# Patient Record
Sex: Female | Born: 1990 | Race: White | Hispanic: No | Marital: Single | Smoking: Never smoker
Health system: Southern US, Community
[De-identification: ages and names within clinical notes are randomized; demographics above are authoritative.]

## PROBLEM LIST (undated history)

## (undated) DIAGNOSIS — F909 Attention-deficit hyperactivity disorder, unspecified type: Secondary | ICD-10-CM

## (undated) DIAGNOSIS — K219 Gastro-esophageal reflux disease without esophagitis: Secondary | ICD-10-CM

## (undated) DIAGNOSIS — L709 Acne, unspecified: Secondary | ICD-10-CM

## (undated) DIAGNOSIS — M92529 Juvenile osteochondrosis of tibia tubercle, unspecified leg: Secondary | ICD-10-CM

## (undated) DIAGNOSIS — M9250 Juvenile osteochondrosis of tibia and fibula, unspecified leg: Secondary | ICD-10-CM

## (undated) HISTORY — DX: Juvenile osteochondrosis of tibia and fibula, unspecified leg: M92.50

## (undated) HISTORY — DX: Gastro-esophageal reflux disease without esophagitis: K21.9

## (undated) HISTORY — DX: Attention-deficit hyperactivity disorder, unspecified type: F90.9

## (undated) HISTORY — DX: Juvenile osteochondrosis of tibia tubercle, unspecified leg: M92.529

## (undated) HISTORY — DX: Acne, unspecified: L70.9

## (undated) HISTORY — PX: WISDOM TOOTH EXTRACTION: SHX21

## (undated) HISTORY — PX: COLONOSCOPY: SHX174

---
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
## 2009-02-26 ENCOUNTER — Ambulatory Visit: Payer: Self-pay | Admitting: Internal Medicine

## 2009-02-26 DIAGNOSIS — F988 Other specified behavioral and emotional disorders with onset usually occurring in childhood and adolescence: Secondary | ICD-10-CM | POA: Insufficient documentation

## 2009-02-26 LAB — CONVERTED CEMR LAB
Glucose, Urine, Semiquant: NEGATIVE
Nitrite: NEGATIVE
WBC Urine, dipstick: NEGATIVE
pH: 6.5

## 2009-03-01 ENCOUNTER — Encounter: Payer: Self-pay | Admitting: Internal Medicine

## 2009-03-04 ENCOUNTER — Telehealth: Payer: Self-pay | Admitting: Internal Medicine

## 2009-03-07 ENCOUNTER — Telehealth: Payer: Self-pay | Admitting: Internal Medicine

## 2009-03-18 ENCOUNTER — Ambulatory Visit: Payer: Self-pay | Admitting: Internal Medicine

## 2009-03-19 ENCOUNTER — Ambulatory Visit: Payer: Self-pay | Admitting: Internal Medicine

## 2009-03-19 LAB — CONVERTED CEMR LAB
Albumin: 4 g/dL (ref 3.5–5.2)
Alkaline Phosphatase: 54 units/L (ref 39–117)
CO2: 29 meq/L (ref 19–32)
Chloride: 103 meq/L (ref 96–112)
Eosinophils Relative: 1.6 % (ref 0.0–5.0)
GFR calc non Af Amer: 114.04 mL/min (ref >60)
Glucose, Bld: 83 mg/dL (ref 70–99)
Lymphocytes Relative: 26.8 % (ref 12.0–46.0)
Monocytes Relative: 6.8 % (ref 3.0–12.0)
Neutrophils Relative %: 64.1 % (ref 43.0–77.0)
Platelets: 185 10*3/uL (ref 150.0–400.0)
Potassium: 4.1 meq/L (ref 3.5–5.1)
Sodium: 138 meq/L (ref 135–145)
TSH: 1.99 microintl units/mL (ref 0.35–5.50)
Total Protein: 7.2 g/dL (ref 6.0–8.3)
WBC: 5.1 10*3/uL (ref 4.5–10.5)

## 2009-03-20 ENCOUNTER — Encounter: Payer: Self-pay | Admitting: Internal Medicine

## 2009-03-21 ENCOUNTER — Telehealth: Payer: Self-pay | Admitting: Internal Medicine

## 2009-03-21 ENCOUNTER — Ambulatory Visit: Payer: Self-pay | Admitting: Internal Medicine

## 2009-03-21 ENCOUNTER — Encounter: Payer: Self-pay | Admitting: Internal Medicine

## 2009-03-22 ENCOUNTER — Encounter: Payer: Self-pay | Admitting: Internal Medicine

## 2009-06-11 ENCOUNTER — Telehealth: Payer: Self-pay | Admitting: Internal Medicine

## 2009-07-03 ENCOUNTER — Ambulatory Visit: Payer: Self-pay | Admitting: Internal Medicine

## 2009-07-03 DIAGNOSIS — L559 Sunburn, unspecified: Secondary | ICD-10-CM | POA: Insufficient documentation

## 2009-07-03 DIAGNOSIS — N926 Irregular menstruation, unspecified: Secondary | ICD-10-CM | POA: Insufficient documentation

## 2009-07-03 LAB — CONVERTED CEMR LAB: LDL Goal: 160 mg/dL

## 2009-07-08 ENCOUNTER — Telehealth: Payer: Self-pay | Admitting: *Deleted

## 2010-01-14 IMAGING — US US ABDOMEN COMPLETE
1 series · 13 of 25 positions shown · non-contrast
Comparison: None.

CLINICAL DATA: Hematuria.  Right-sided abdominal pain.

COMPLETE ABDOMINAL ULTRASOUND 07/26/2008:

[Series 1: us abdomen complete · 0.23mm/px · 13 of 99 slices shown]
[im 1/99]
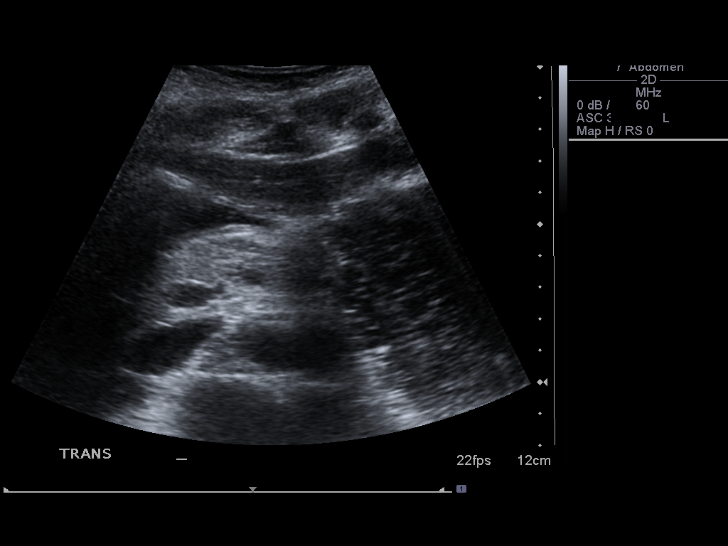
[im 9/99]
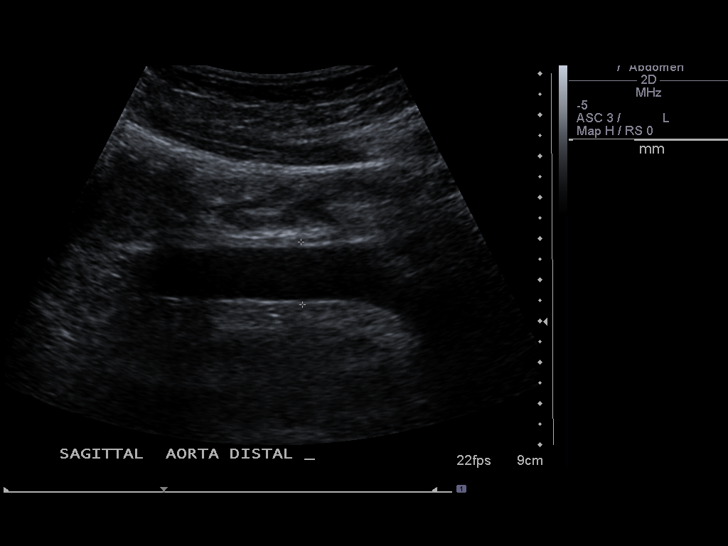
[im 17/99]
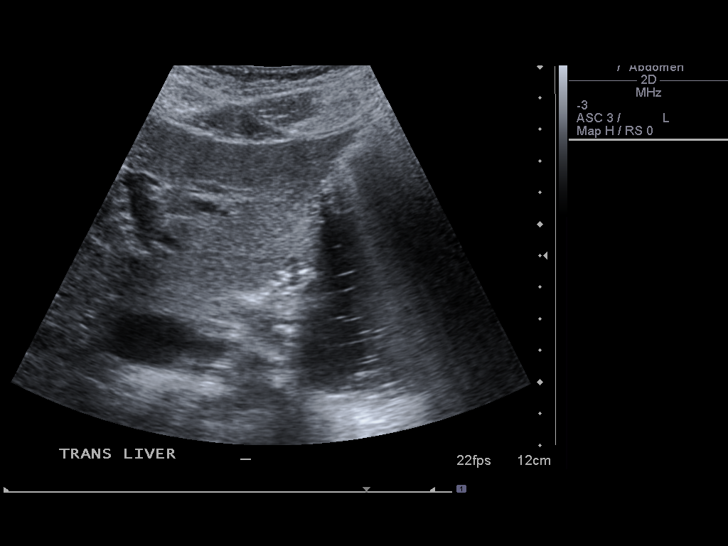
[im 25/99]
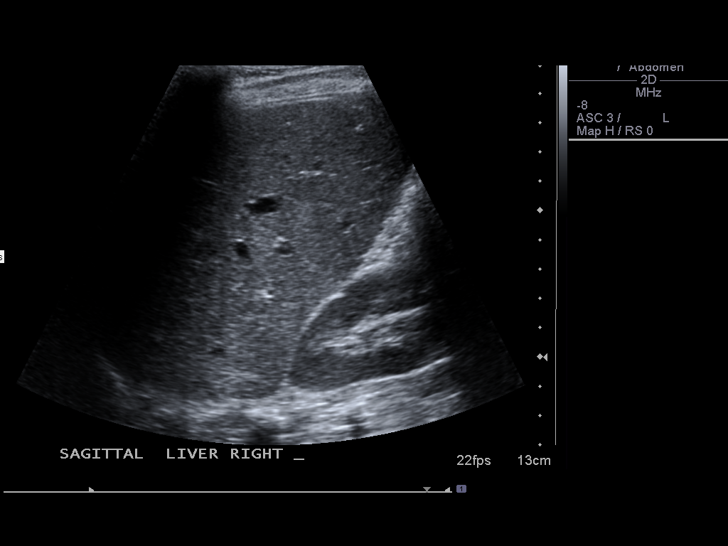
[im 33/99]
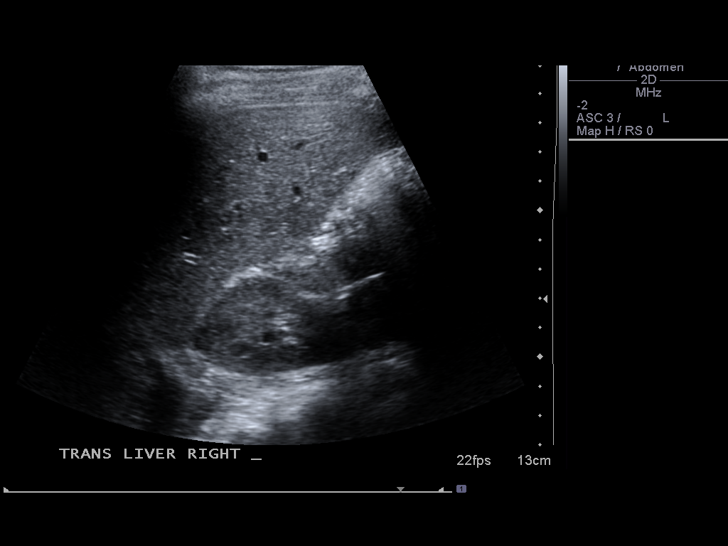
[im 41/99]
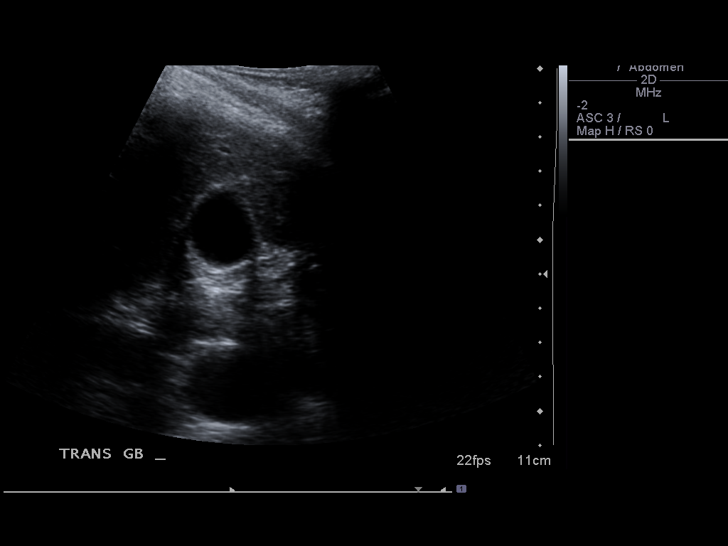
[im 50/99]
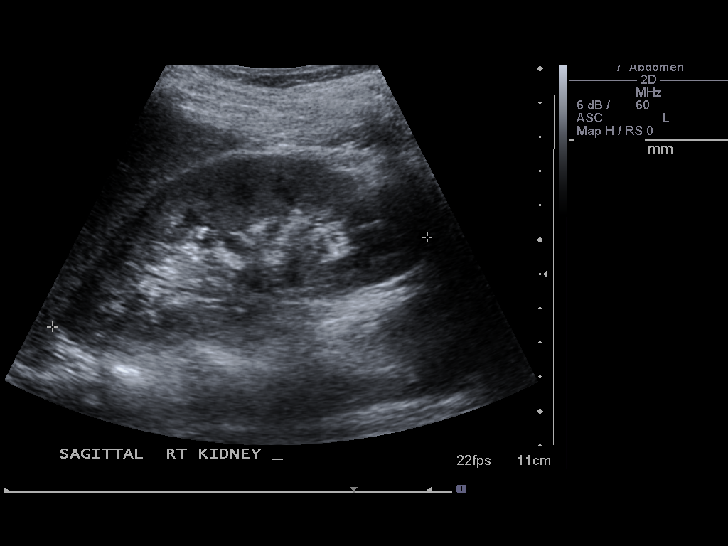
[im 58/99]
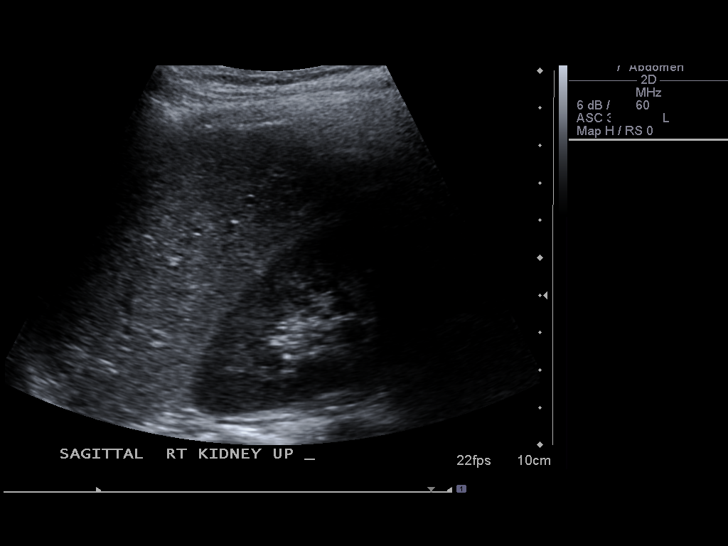
[im 66/99]
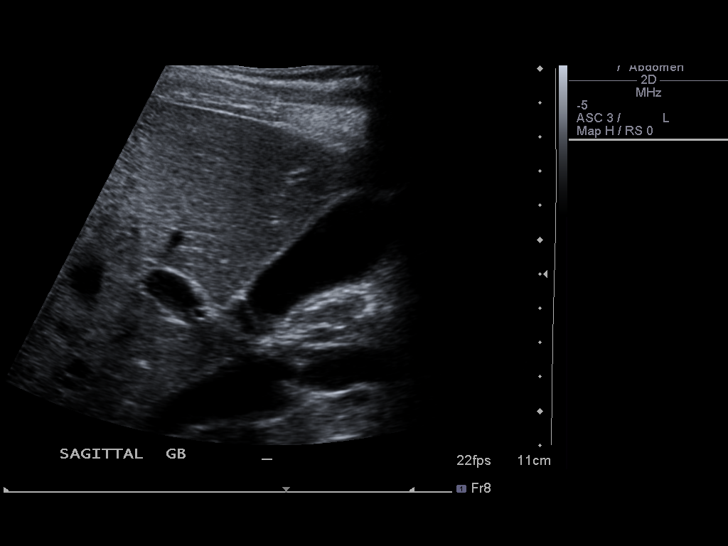
[im 74/99]
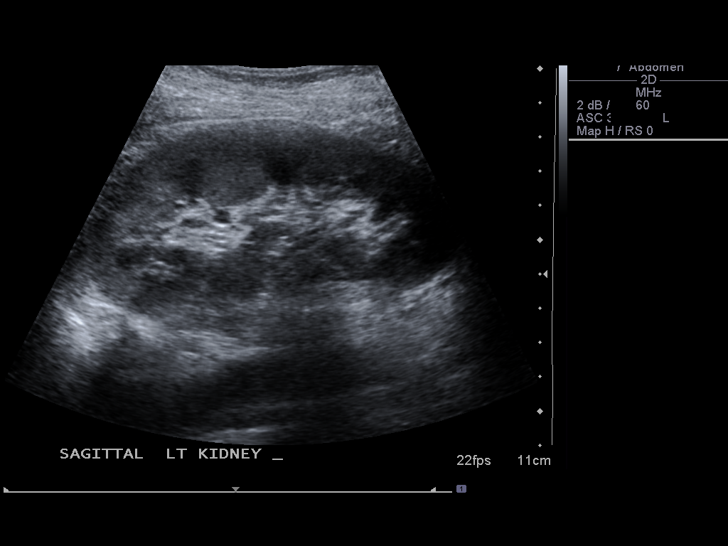
[im 82/99]
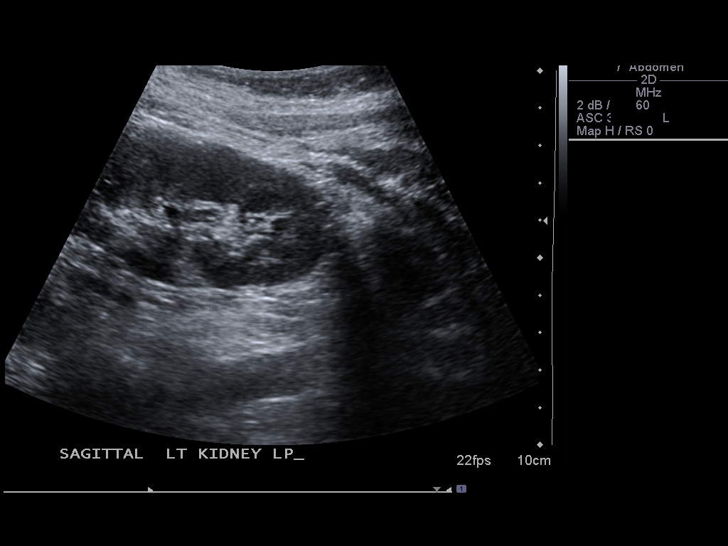
[im 90/99]
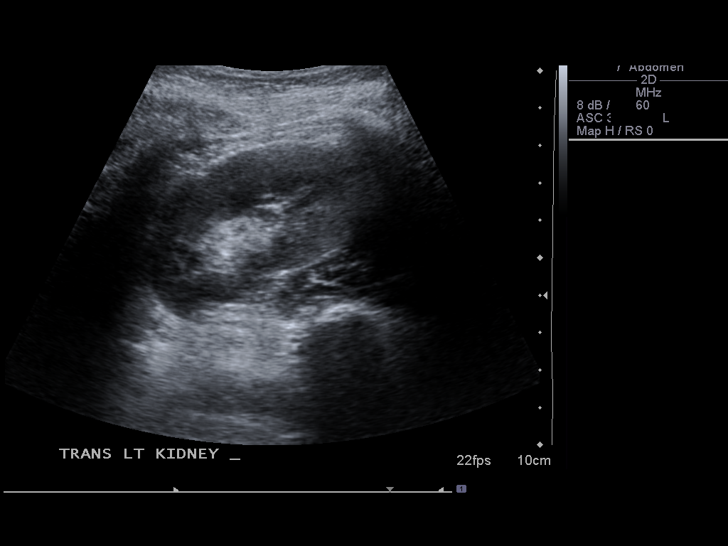
[im 99/99]
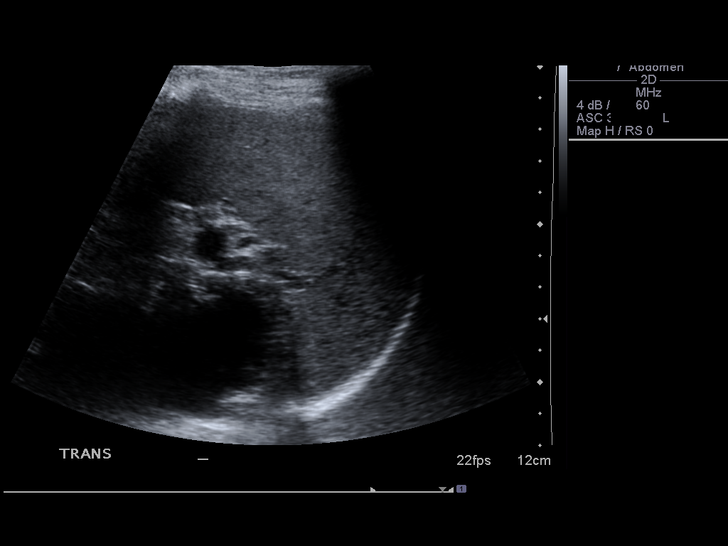

[13 of 25 positions shown; findings below may reference images not displayed]

FINDINGS: Gallbladder:  No shadowing gallstones or echogenic sludge.  No
gallbladder wall thickening or pericholecystic fluid.  Negative
sonographic Murphy's sign according to the ultrasound technologist.

Common bile duct:  Normal in caliber with maximum diameter
approximating 4 mm.

Liver:  Normal size and echotexture without focal parenchymal
abnormalities.  Patent portal vein with hepatopedal flow.

IVC:  Patent.

Pancreas:  Normal appearing head and body; tail obscured by
overlying bowel gas.

Spleen:  Normal size and echotexture without focal parenchymal
abnormality, measuring approximately 9.7 cm length.

Right Kidney:  No hydronephrosis.  Well-preserved cortex.  Normal
parenchymal echotexture without focal abnormalities.  No shadowing
calculi. Approximately 11.2 cm in length.

Left Kidney:  No hydronephrosis.  Well-preserved cortex.  Normal
parenchymal echotexture without focal abnormalities.  No shadowing
calculi. Approximately 11.0 in length.

Abdominal aorta:  Normal in caliber throughout its visualized
course in the abdomen with maximum diameter approximating 1.7 cm.

Other Findings:  Note made of fluid within the stomach.
IMPRESSION: Normal abdominal ultrasound with a caveat that the pancreatic tail
was obscured by overlying bowel gas and was therefore not
evaluated.  Of the

## 2010-02-05 ENCOUNTER — Encounter: Payer: Self-pay | Admitting: Internal Medicine

## 2010-04-15 NOTE — Progress Notes (Signed)
Summary: change from Autumn Castillo to seaonale  Phone Note From Pharmacy   Caller: [facility]  [address]  [phone]* Summary of Call: Pt is wanting to try to generic seasonale because Autumn Castillo is too expensive. Is it okay to change? Initial call taken by: Romualdo Bolk, CMA Duncan Dull),  July 08, 2009 9:38 AM  Follow-up for Phone Call        ok to do this  Follow-up by: Madelin Headings MD,  July 08, 2009 1:15 PM  Additional Follow-up for Phone Call Additional follow up Details #1::        Rx sent to pharmacy. Additional Follow-up by: Romualdo Bolk, CMA (AAMA),  July 08, 2009 3:25 PM    New/Updated Medications: SEASONALE 0.15-0.03 MG TABS (LEVONORGEST-ETH ESTRAD 91-DAY) 1 by mouth once daily Prescriptions: SEASONALE 0.15-0.03 MG TABS (LEVONORGEST-ETH ESTRAD 91-DAY) 1 by mouth once daily  #90 x 3   Entered by:   Romualdo Bolk, CMA (AAMA)   Authorized by:   Madelin Headings MD   Signed by:   Romualdo Bolk, CMA (AAMA) on 07/08/2009   Method used:   Electronically to        [facility]  [phone]* (retail)       [address]       [place], [place]  [address]       Ph: [phone] or [phone]       Fax: [phone]   RxID:   [id number]

## 2010-04-15 NOTE — Assessment & Plan Note (Signed)
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 She also has occasional nausea and vomiting. She has noticed intermittent bright red blood per rectum associated with normal-appearing bowel movements. She denies any rectal pain or history of hemorrhoids. She denies being constipated. Her weight has been stable. There is no family history of inflammatory bowel disease. She has not had any imaging studies other than a pelvic ultrasound. Her gynecologic evaluation was negative as far as the source of the pain is concerned. The pain is partially positional. It may be precipitated by physical activity although jogging does not bother her. It is not relieved by bowel movements or eating. The first episode of acute pain 2 years ago was relieved by vomiting. She takes Adderall 10 mg p.r.n. as well as Vivanse 40mg  by mouth once daily only when she is taking classes.AdHD   GI Review of Systems    Reports abdominal pain.     Location of  Abdominal pain: lower abdomen.    Denies acid reflux, belching, bloating, chest pain, dysphagia with liquids, dysphagia with solids, heartburn, loss of appetite, nausea, vomiting, vomiting blood, weight loss, and  weight gain.      Reports black tarry stools and  rectal bleeding.     Denies anal fissure, change in bowel habit, constipation, diarrhea, diverticulosis, fecal incontinence, heme positive stool, hemorrhoids, irritable bowel syndrome, jaundice, light color stool, liver problems, and  rectal pain.    Current Medications (verified): 1)  Vyvanse 40 Mg Caps (Lisdexamfetamine Dimesylate) .Marland Kitchen.. 1  By Mouth Once Daily 2)  Adderall 10 Mg Tabs (Amphetamine-Dextroamphetamine) .Marland Kitchen.. 1 By Mouth Once Daily As Needed For Homework 3)  Doryx(Dosage Unknown) .... One Tablet By Mouth Once Daily  Allergies (verified): No Known Drug Allergies  Past History:  Past Medical History: Reviewed history from 02/26/2009 and no changes required. Unremarkable fx arm 5th grade  osgood schlatter unremarkable birth hx  Acne  RX for adhd  Past Surgical History: Reviewed history from 11/02/2006 and no changes required. Denies surgical history  Family History: Reviewed history from 03/11/2009 and no changes required. Family History of Allergies No hx of renal stones or chronic GI disease PGf with GB disease No FH of Colon Cancer:  Social History: Reviewed history from 02/26/2009 and no changes required. Single intact family no tad no Oncologist  on campus. Ocass alcohol not a lot of caffeine   Review of Systems  The patient denies allergy/sinus, anemia, anxiety-new, arthritis/joint pain, back pain, blood in urine, breast changes/lumps, change in vision, confusion, cough, coughing up blood, depression-new, fainting, fatigue, fever, headaches-new, hearing problems, heart murmur, heart rhythm changes, itching, menstrual pain, muscle pains/cramps, night sweats, nosebleeds, pregnancy symptoms, shortness of breath, skin rash, sleeping problems, sore throat, swelling of feet/legs, swollen lymph glands, thirst - excessive , urination - excessive , urination changes/pain, urine leakage, vision changes, and voice change.         Pertinent positive and negative review of systems were noted in the above HPI. All other ROS was otherwise negative.   Vital Signs:  Patient profile:   20 year old female Menstrual status:  regular Height:      63.25 inches Weight:      139 pounds BMI:     24.52 BSA:     1.66 Pulse rate:   76 / minute Pulse rhythm:   regular BP sitting:   102 / 64  (left  arm) Cuff size:   regular  Vitals Entered By: Ok Anis CMA (March 18, 2009 10:42 AM)  Physical Exam  General:  Well developed, well nourished, no acute distress. Eyes:  PERRLA, no icterus. Mouth:  No deformity or lesions, dentition normal. Neck:  Supple; no masses or thyromegaly. Lungs:  Clear throughout to auscultation. Heart:  Regular rate and rhythm; no murmurs, rubs,  or bruits. Abdomen:  soft abdomen with good muscle support. Normoactive bowel sounds. No distention. Minimal tenderness in the right upper quadrant as well as right lower quadrant. No palpable mass or stool. Rectal:  normal rectal tone with soft Hemoccult negative stool. No external hemorrhoids. Msk:  straight leg raising, sitting up and laying down did not precipitate the abdominal pain. Extremities:  No clubbing, cyanosis, edema or deformities noted. Neurologic:  slow DTRs. Skin:  packing Psych:  Alert and cooperative. Normal mood and affect.   Impression & Recommendations:  Problem # 1:  ABDOMINAL PAIN RIGHT LOWER QUADRANT (ICD-789.03) Patient has chronic intermittent right lower quadrant abdominal pain without triggering factors. This could possibly be an element of musculoskeletal pain. It is very unlikely that we are dealing with chronic appendicitis but there is the possiblity of an internal abdominal hernia or Crohn's disease especially in association with her recent rectal bleeding. Functional problems may also precipitate the right lower quadrant abdominal pain; specifically irritable bowel syndrome and functional constipation. Gynecologic problems have been ruled out by her gynecologist but are still a possibility. We will proceed with a CT scan of the abdomen using oral and IV contrast with attention to the right lower quadrant. We will also proceed with a colonoscopy to look at the ileocecal valve, terminal ileum and obtain appropriate biopsies. If the workup is negative, I would encourage her to take  Levsin or Levbid on a regular basis.  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  Problem # 3:  ADD (ICD-314.00) Patient is on low dose Adderall.  Patient Instructions: 1)  TSH, sedimentation rate, CBC, CMET and tissue transglutaminase. 2)  Schedule CT scan of the abdomen and pelvis with attention to right lower quadrant. 3)  Colonoscopy with visualization of the right colon and terminal ileum. 4)  Consider the use of antispasmodics. 5)  Copy sent to : Dr Coral Ceo Prescriptions: DULCOLAX 5 MG  TBEC (BISACODYL) Day before procedure take 2 at 3pm and 2 at 8pm.  #4 x 0   Entered by:   Hortense Ramal CMA (AAMA)   Authorized by:   Hart Carwin MD   Signed by:   Hortense Ramal CMA (AAMA) on 03/18/2009   Method used:   Electronically to        [facility]  [phone]* (retail)       [address]       [place], [place]  [address]       Ph: [phone] or [phone]       Fax: [phone]   RxID:   [id number] REGLAN 10 MG  TABS (METOCLOPRAMIDE HCL) As per prep instructions.  #2 x 0   Entered by:   Hortense Ramal CMA (AAMA)   Authorized by:   Hart Carwin MD   Signed by:   Hortense Ramal CMA (AAMA) on 03/18/2009   Method used:   Electronically to        [facility]  [phone]* (retail)       [address]       [place], [place]  [address]       Ph: [phone] or [phone]       Fax: [phone]   RxID:   [id number] MIRALAX   POWD (POLYETHYLENE GLYCOL 3350) As per prep  instructions.  #255gm x 0   Entered by:   Hortense Ramal CMA (AAMA)   Authorized  by:   Hart Carwin MD   Signed by:   Hortense Ramal CMA (AAMA) on 03/18/2009   Method used:   Electronically to        [facility]  [phone]* (retail)       [address]       [place], [place]  [address]       Ph: [phone] or [phone]       Fax: [phone]   RxID:   [id number]

## 2010-04-15 NOTE — Consult Note (Signed)
Summary: [facility]  [facility]   Imported By: Maryln Gottron 03/20/2009 10:27:52  _____________________________________________________________________  External Attachment:    Type:   Image     Comment:   External Document

## 2010-04-15 NOTE — Miscellaneous (Signed)
Summary: levsin rx  Clinical Lists Changes  Medications: Added new medication of LEVSIN/SL 0.125 MG  SUBL (HYOSCYAMINE SULFATE) every 4 hours as needed for abd pain - Signed Rx of LEVSIN/SL 0.125 MG  SUBL (HYOSCYAMINE SULFATE) every 4 hours as needed for abd pain;  #30 x 1;  Signed;  Entered by: Oda Cogan;  Authorized by: Hart Carwin MD;  Method used: Electronically to [facility]  [phone]*, [address] [place], [place], [place]  [address], Ph: [phone] or [phone], Fax: [phone]    Prescriptions: LEVSIN/SL 0.125 MG  SUBL (HYOSCYAMINE SULFATE) every 4 hours as needed for abd pain  #30 x 1   Entered by:   Oda Cogan   Authorized by:   Hart Carwin MD   Signed by:   Oda Cogan on 03/19/2009   Method used:   Electronically to        [facility]  [phone]* (retail)       [address]       [place], [place]  [address]       Ph: [phone] or [phone]       Fax: [phone]   RxID:   [id number]

## 2010-04-15 NOTE — Assessment & Plan Note (Signed)
Summary: DISCUSS BC // RS/pt rescd//ccm   Vital Signs:  Patient profile:   20 year old female Menstrual status:  regular LMP:     06/10/2009 Weight:      136 pounds Pulse rate:   78 / minute BP sitting:   110 / 70  (left arm) Cuff size:   regular  Vitals Entered By: Romualdo Bolk, CMA Duncan Dull) (July 03, 2009 3:38 PM) CC: Discuss going on ocps LMP (date): 06/10/2009 LMP - Character: normal Menarche (age onset years): 12   Menses interval (days): 28-30 Menstrual flow (days): 5 Enter LMP: 06/10/2009   History of Present Illness: Autumn Castillo comesin comes in today  for irreg periods and   request for hormonal therapy.    Not for contraception . periods range from    14-17 days.     to 45 days . Has ahd neg gi eval for her transient abd pain and neg Ct of pelvis abdomen  this is recurring at times but no progression. No nausea weight change  sweats or fever. Interested in seasonique  continuous therapy   .  Also has acne on antibioitc doing well.   Preventive Screening-Counseling & Management  Alcohol-Tobacco     Alcohol drinks/day: <1     Smoking Status: never  Caffeine-Diet-Exercise     Caffeine use/day: 1     Does Patient Exercise: yes     Type of exercise: running, bike, wts     Exercise (avg: min/session): >60     Times/week: 7  Current Medications (verified): 1)  Vyvanse 40 Mg Caps (Lisdexamfetamine Dimesylate) .Marland Kitchen.. 1 By Mouth Once Daily 2)  Adderall 10 Mg Tabs (Amphetamine-Dextroamphetamine) .Marland Kitchen.. 1 By Mouth Once Daily As Needed For Homework 3)  Levsin/sl 0.125 Mg  Subl (Hyoscyamine Sulfate) .... Every 4 Hours As Needed For Abd Pain  Allergies (verified): No Known Drug Allergies  Past History:  Past medical, surgical, family and social histories (including risk factors) reviewed, and no changes noted (except as noted below).  Past Medical History: Unremarkable fx arm 5th grade  osgood schlatter unremarkable birth hx  Acne  RX for adhd abdominal  pain recurrent  hx of hematuria neg scan and [place]  declined cysto  Past Surgical History: Denies surgical history colonscopy and bx   Past History:  Care Management: Dermatology: Dr. Emily Filbert Gastroenterology: Joaquim Nam Psychiatrist:: [facility] Urologist peterson  Family History: Reviewed history from 03/11/2009 and no changes required. Family History of Allergies No hx of renal stones or chronic GI disease PGf with GB disease No FH of Colon Cancer:  Social History: Reviewed history from 02/26/2009 and no changes required. Single intact family no tad no Oncologist  on campus. Ocass alcohol not a lot of caffeine   Review of Systems  The patient denies anorexia, fever, chest pain, syncope, dyspnea on exertion, peripheral edema, prolonged cough, melena, hematochezia, abnormal bleeding, enlarged lymph nodes, and angioedema.    Physical Exam  General:  alert, well-developed, well-nourished, and well-hydrated.  sunburned face    peeling  resolving  Head:  normocephalic and atraumatic.   Eyes:  vision grossly intact and pupils equal.   Ears:  no external deformities.   Neck:  No deformities, masses, or tenderness noted. Lungs:  Normal respiratory effort, chest expands symmetrically. Lungs are clear to auscultation, no crackles or wheezes. Heart:  Normal rate and regular rhythm. S1 and S2 normal without gallop, murmur, click, rub or other extra sounds. Abdomen:  Bowel sounds positive,abdomen soft and non-tender without masses, organomegaly or hernias noted. Pulses:  pulses intact without delay   Extremities:  no clubbing cyanosis or edema  Neurologic:  non focal  Skin:  turgor normal, no ecchymoses, and no petechiae.  suncurn  Cervical Nodes:  No lymphadenopathy noted Psych:  Oriented X3, good eye contact, not anxious appearing, and not depressed appearing.     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  ok candidate for ocps .   some irreg  poss at onset of med    plan rov in 3 months and call in meantime if needed Her updated medication list for this problem includes:    Seasonique 0.15-0.03 &0.01 Mg Tabs (Levonorgest-eth estrad 91-day) .Marland Kitchen... 1 by mouth once daily  Problem # 2:  ACNE, MILD (ICD-706.1) stable    Problem # 3:  SUNBURN (ICD-692.71) healing  face   phototoxic plus exposure recently  no complications   pt aware of safety  interventions  rec.  counseled   Problem # 4:  ABDOMINAL PAIN, EPIGASTRIC (ICD-789.06) ? cause    has had gi and some uro  work up.  ( deferred cysto)   stable  with no other alarm features.      Complete Medication List: 1)  Vyvanse 40 Mg Caps (Lisdexamfetamine dimesylate) .Marland Kitchen.. 1 by mouth once daily 2)  Adderall 10 Mg Tabs (Amphetamine-dextroamphetamine) .Marland Kitchen.. 1 by mouth once daily as needed for homework 3)  Levsin/sl 0.125 Mg Subl (Hyoscyamine sulfate) .... Every 4 hours as needed for abd pain 4)  Seasonique 0.15-0.03 &0.01 Mg Tabs (Levonorgest-eth estrad 91-day) .Marland Kitchen.. 1 by mouth once daily   Patient Instructions: 1)  can  begin  seasonique 2)  return office visit in 3 months  .  call in meantime .  Prescriptions: SEASONIQUE 0.15-0.03 &0.01 MG TABS (LEVONORGEST-ETH ESTRAD 91-DAY) 1 by mouth once daily  #90 days x 3   Entered and Authorized by:   Madelin Headings MD   Signed by:   Madelin Headings MD on 07/03/2009   Method used:   Electronically to        [facility]  [phone]* (retail)       [address]       [place], [place]  [address]       Ph: [phone] or [phone]       Fax: [phone]   RxID:   [id number]

## 2010-04-15 NOTE — Consult Note (Signed)
Summary: [facility]  [facility]   Imported By: Maryln Gottron 03/27/2009 14:52:56  _____________________________________________________________________  External Attachment:    Type:   Image     Comment:   External Document

## 2010-04-15 NOTE — Letter (Signed)
Summary: [facility]  [facility]   Imported By: Maryln Gottron 02/14/2010 13:14:51  _____________________________________________________________________  External Attachment:    Type:   Image     Comment:   External Document

## 2010-04-15 NOTE — Letter (Signed)
Summary: Patient Notice- Colon Biospy Results  [facility]  [address] [place], [place] [address]   Phone: [phone]  Fax: [phone]        March 21, 2009 MRN: [id number]    Autumn Castillo [address] [place], [place]  [address]    Dear Ms. Coward,  I am pleased to inform you that the biopsies taken during your recent colonoscopy did not show any evidence of cancer upon pathologic examination.The biopsies from Your Terminal ileum show no evidence for Crohn's disease.The bleeding episodes were likely caused by a local iritation of the rectal area.  Additional information/recommendations:  __No further action is needed at this time.  Please follow-up with      your primary care physician for your other healthcare needs.  _x_Please call [phone] to schedule a return visit to review      your condition.  _x_Continue with the treatment plan as outlined on the day of your      exam.  _  Please call us if you are having persistent problems or have questions about your condition that have not been fully answered at this time.  Sincerely,  Hart Carwin MD   This letter has been electronically signed by your physician.  Appended Document: Patient Notice- Colon Biospy Results Letter mailed 01.12.11

## 2010-04-15 NOTE — Progress Notes (Signed)
Summary: Triage  Phone Note Call from Patient Call back at [phone]   Caller: mother  Patty Call For: Dr. Juanda Chance Reason for Call: Talk to Nurse Summary of Call: Pt. & her mother have questions about the CT results  Initial call taken by: Karna Christmas,  March 21, 2009 2:12 PM  Follow-up for Phone Call        Dr.Ki Corbo spoke with pt. about CT/labs. Pt. mother has a few more questions- 1) We haven't resolved where her rectal bleeding is comming from is there something more we should do? 2) She also has reflux and issues eating, certain foods cause vomiting and/or epigastric pain, what needs to be done? 3) She saw a Urologist yesterday, because of blood in her urine. The urologist wants to go in her bladder to see what is causing the blood in her urine. Does Dr.Jazmina Muhlenkamp recommend she follow the Urologists recommendations? 4) Is the cyst in her liver causing the blood in her urine and rectum?  5) What should be the next step in figuring out the reason for her lower abd. pain?   DR.Tyra Gural PLEASE ADVISE OR YOU CAN CALL PT'S MOTHER, PATTY, AT [phone].  Follow-up by: Laureen Ochs LPN,  March 21, 2009 2:33 PM  Additional Follow-up for Phone Call Additional follow up Details #1::        Yes, she ought to continue to see Urologist. She also needs to take the medication I have prescribed for her. to see if it works. rectal bleeding  from the rectal source, probable a fissure, nothing significant. The colonoscopy was normal. I will see her back when I get back if she continues to have problems. Additional Follow-up by: Hart Carwin MD,  March 21, 2009 7:17 PM    Additional Follow-up for Phone Call Additional follow up Details #2::    Above MD orders reviewed with patient's mother. She declines an appt. at this time. Pt. instructed to call back as needed.  Follow-up by: Laureen Ochs LPN,  March 22, 2009 8:43 AM

## 2010-04-15 NOTE — Procedures (Signed)
Summary: Colonoscopy  Patient: Autumn Castillo Note: All result statuses are Final unless otherwise noted.  Tests: (1) Colonoscopy (COL)   COL Colonoscopy           DONE     [facility]     [address]     [place], [place]  [address]           COLONOSCOPY PROCEDURE REPORT           PATIENT:  Autumn Castillo  MR#:  [id number]     BIRTHDATE:  12-23-1990, 18 yrs. old  GENDER:  female           ENDOSCOPIST:  Hedwig Morton. Juanda Chance, MD     Referred by:  Neta Mends. Panosh, M.D.           PROCEDURE DATE:  03/19/2009     PROCEDURE:  Colonoscopy 19147     ASA CLASS:  Class I     INDICATIONS:  abdominal pain chronic intermittent RLQ abd. pain,     negative Gyn work-up,           MEDICATIONS:   Versed 4 mg, Fentanyl 50 mcg           DESCRIPTION OF PROCEDURE:   After the risks benefits and     alternatives of the procedure were thoroughly explained, informed     consent was obtained.  Digital rectal exam was performed and     revealed no rectal masses.   The LB CF-H180AL [id number] endoscope     was introduced through the anus and advanced to the terminal ileum     which was intubated for a short distance, without limitations.     The quality of the prep was good, using MiraLax.  The instrument     was then slowly withdrawn as the colon was fully examined.     <<PROCEDUREIMAGES>>           FINDINGS:  No polyps or cancers were seen. With standard forceps,     biopsy was obtained and sent to pathology (see image3 and image4).     normal appearing Terminal ileum- Bx  A normal appearing cecum,     ileocecal valve, and appendiceal orifice were identified. The     ascending, hepatic flexure, transverse, splenic flexure,     descending, sigmoid colon, and rectum appeared unremarkable (see     image6, image5, image2, and image1).   Retroflexed views in the     rectum revealed no abnormalities.    The scope was then withdrawn     from the patient and the procedure completed.        COMPLICATIONS:  None           ENDOSCOPIC IMPRESSION:     1) No polyps or cancers     2) Normal colon     s/p biopsies of the normal appearing terminal ileum     RECOMMENDATIONS:     1) Await biopsy results     CT scan of the abd. and pelvis tomorrow, Labs pending     trial of Levsin SL.125mg  q 4 hrs prn abd. pain           REPEAT EXAM:  In 0 year(s) for.           ______________________________     Hedwig Morton. Juanda Chance, MD           CC:           n.  eSIGNED:   Hedwig Morton. Nikash Mortensen at 03/19/2009 10:53 AM           Karlene Lineman, [id number]  Note: An exclamation mark (!) indicates a result that was not dispersed into the flowsheet. Document Creation Date: 03/19/2009 10:54 AM _______________________________________________________________________  (1) Order result status: Final Collection or observation date-time: 03/19/2009 10:45 Requested date-time:  Receipt date-time:  Reported date-time:  Referring Physician:   Ordering Physician: [name] [phone]) Specimen Source:  Source: Launa Grill Order Number: [id number] Lab site:

## 2010-04-15 NOTE — Letter (Signed)
Summary: [facility] Instructions  [facility]  [address] [place], [place] [address]   Phone: [phone]  Fax: [phone]       Autumn Castillo    1990-07-09    MRN: [id number]       Procedure Day /Date: 03/19/09     Arrival Time: 9:00 am     Procedure Time: 10:00 am     Location of Procedure:                    _ x_  [facility] (4th Floor)  PREPARATION FOR COLONOSCOPY WITH MIRALAX   THE DAY BEFORE YOUR PROCEDURE         DATE: 03/18/09 DAY: Monday  1   Drink clear liquids the entire day-NO SOLID FOOD  2   Do not drink anything colored red or purple.  Avoid juices with pulp.  No orange juice.  3   Drink at least 64 oz. (8 glasses) of fluid/clear liquids during the day to prevent dehydration and help the prep work efficiently.  CLEAR LIQUIDS INCLUDE: Water Jello Ice Popsicles Tea (sugar ok, no milk/cream) Powdered fruit flavored drinks Coffee (sugar ok, no milk/cream) Gatorade Juice: apple, white grape, white cranberry  Lemonade Clear bullion, consomm, broth Carbonated beverages (any kind) Strained chicken noodle soup Hard Candy  4   Mix the entire bottle of Miralax with 64 oz. of Gatorade/Powerade in the morning and put in the refrigerator to chill.  5   At 3:00 pm take 2 Dulcolax/Bisacodyl tablets.  6   At 4:30 pm take one Reglan/Metoclopramide tablet.  7  Starting at 5:00 pm drink one 8 oz glass of the Miralax mixture every 15-20 minutes until you have finished drinking the entire 64 oz.  You should finish drinking prep around 7:30 or 8:00 pm.  8   If you are nauseated, you may take the 2nd Reglan/Metoclopramide tablet at 6:30 pm.        9    At 8:00 pm take 2 more DULCOLAX/Bisacodyl tablets.        THE DAY OF YOUR PROCEDURE      DATE:  03/19/09 DAY: Tuesday  You may drink clear liquids until 8:00 am  (2 HOURS BEFORE PROCEDURE).   MEDICATION INSTRUCTIONS  Unless otherwise instructed, you should take regular prescription  medications with a small sip of water as early as possible the morning of your procedure.        OTHER INSTRUCTIONS  You will need a responsible adult at least 21 years of age to accompany you and drive you home.   This person must remain in the waiting room during your procedure.  Wear loose fitting clothing that is easily removed.  Leave jewelry and other valuables at home.  However, you may wish to bring a book to read or an iPod/MP3 player to listen to music as you wait for your procedure to start.  Remove all body piercing jewelry and leave at home.  Total time from sign-in until discharge is approximately 2-3 hours.  You should go home directly after your procedure and rest.  You can resume normal activities the day after your procedure.  The day of your procedure you should not:   Drive   Make legal decisions   Operate machinery   Drink alcohol   Return to work  You will receive specific instructions about eating, activities and medications before you leave.   The above instructions have been reviewed and  explained to me by   Hortense Ramal, CMA    I fully understand and can verbalize these instructions _____________________________ Date 03/18/09

## 2010-04-15 NOTE — Letter (Signed)
Summary: [facility]  [facility] [facility]   Imported By: Marylou Mccoy 05/02/2009 08:38:12  _____________________________________________________________________  External Attachment:    Type:   Image     Comment:   External Document

## 2010-04-15 NOTE — Progress Notes (Signed)
Summary: REQ FOR APPT (CPX, WCC, OR ROA)  Phone Note Call from Patient   Caller: Patient  [phone] Reason for Call: Talk to Nurse, Talk to Doctor Summary of Call: Pt called in to schedule an appt with Dr Fabian Sharp to begin Birth Control...Marland KitchenMarland Kitchen Pt wishes to discuss same.... Okay to set up as a WCC or do you want  pt to be seen as a CPX or ROA slot? Pt hasn't had a recent CPX...Marland KitchenMarland Kitchen Please advise and I will set pt up for appt.  Pt can be reached at [phone] for any questions or concerns. Initial call taken by: Debbra Riding,  June 11, 2009 5:05 PM  Follow-up for Phone Call        Per Dr. Fabian Sharp- can schedule a roa for this one. Follow-up by: Romualdo Bolk, CMA Duncan Dull),  June 11, 2009 5:09 PM  Additional Follow-up for Phone Call Additional follow up Details #1::        Phone Call Complete---- Pt was contacted and appt was scheduled for Friday, 06/28/2009 at 3:00pm with Dr Fabian Sharp.  Additional Follow-up by: Debbra Riding,  June 12, 2009 9:09 AM

## 2010-07-10 ENCOUNTER — Other Ambulatory Visit: Payer: Self-pay | Admitting: Internal Medicine

## 2010-07-10 NOTE — Telephone Encounter (Signed)
Rx sent to pharmacy and letter sent to pt to schedule a cpx appt.

## 2010-09-09 IMAGING — CT CT ABD-PELV W/ CM
2 of 4 series · 17 of 46 positions shown, 19 images · IV contrast (Omnipaque 300)
Comparison: None

CLINICAL DATA: Right lower quadrant pain, blood in stool,
hematuria, nausea, vomiting, recent colonoscopy

CT ABDOMEN AND PELVIS WITH CONTRAST
TECHNIQUE: Multidetector CT imaging of the abdomen and pelvis was
performed following the standard protocol during bolus
administration of intravenous contrast. Breast shield utilized.
Sagittal and coronal MPR images reconstructed from axial data set.
Contrast: Dilute oral contrast by 100 ml 0mnipaque-1DD IV

[Series 2: abd/ pel · axial · 0.71mm/px · z∈[-436,-6]mm · 14 of 94 slices shown, 16 images]
[im 4/94  soft-tissue]
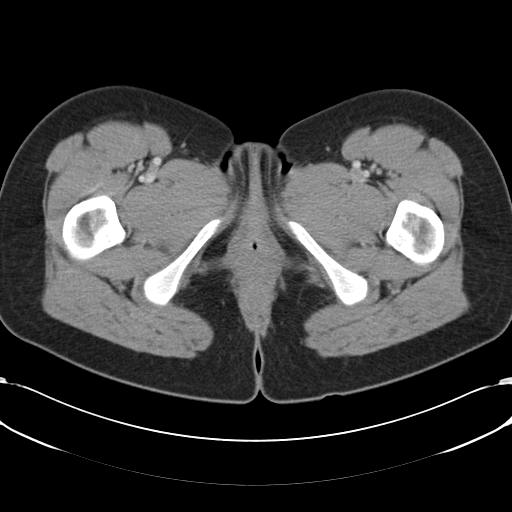
[im 4/94  bone]
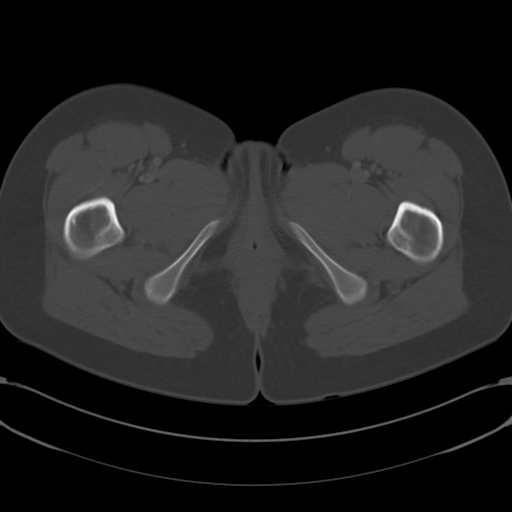
[im 12/94  soft-tissue]
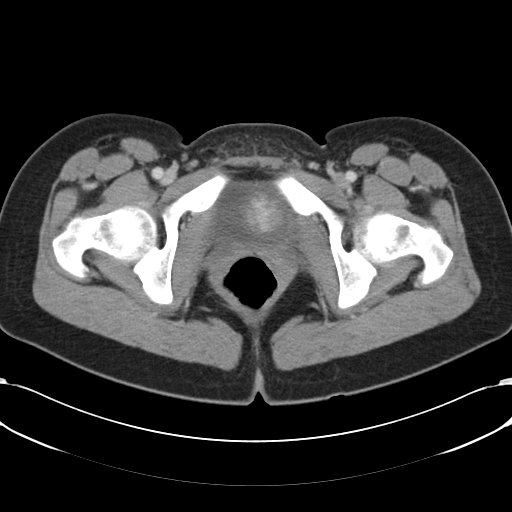
[im 20/94  soft-tissue]
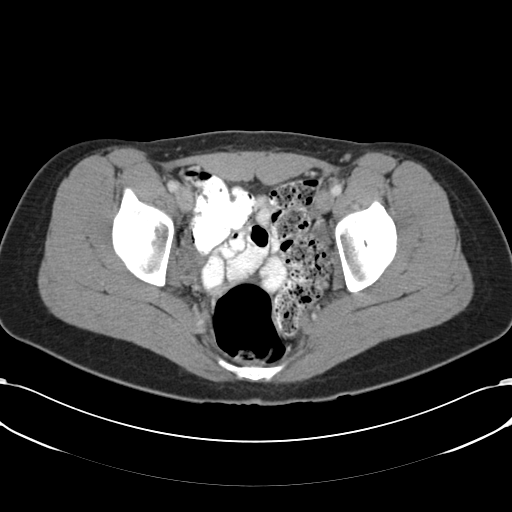
[im 24/94  soft-tissue]
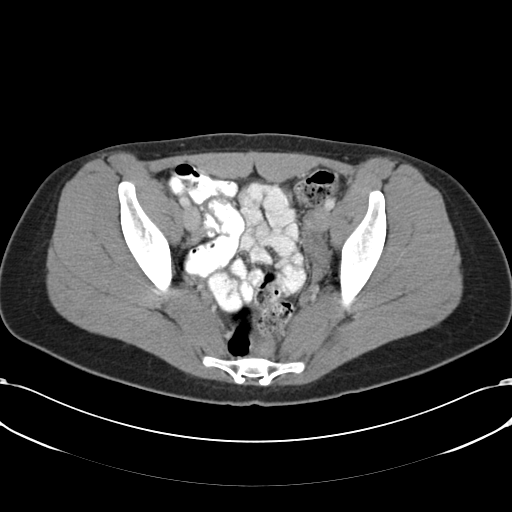
[im 32/94  soft-tissue]
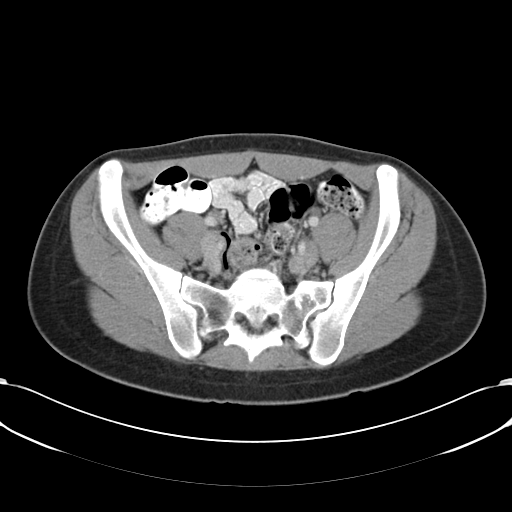
[im 39/94  soft-tissue]
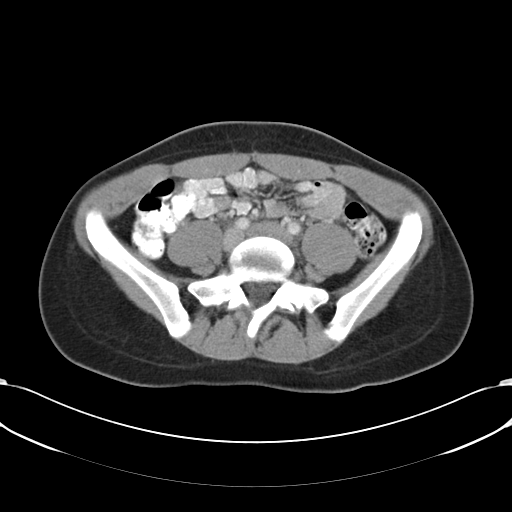
[im 43/94  soft-tissue]
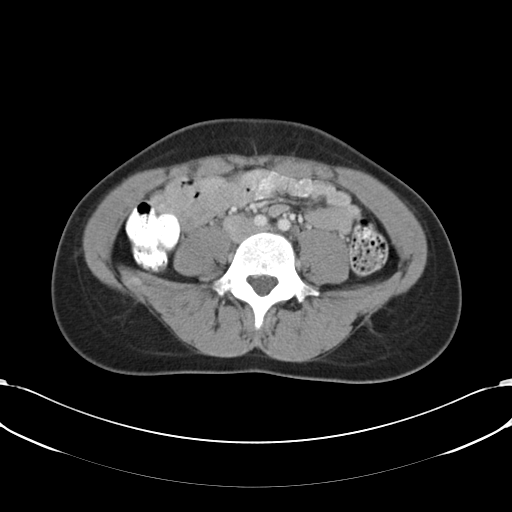
[im 51/94  soft-tissue]
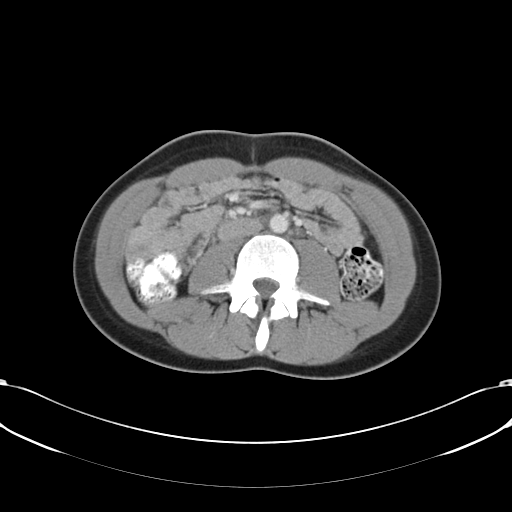
[im 55/94  soft-tissue]
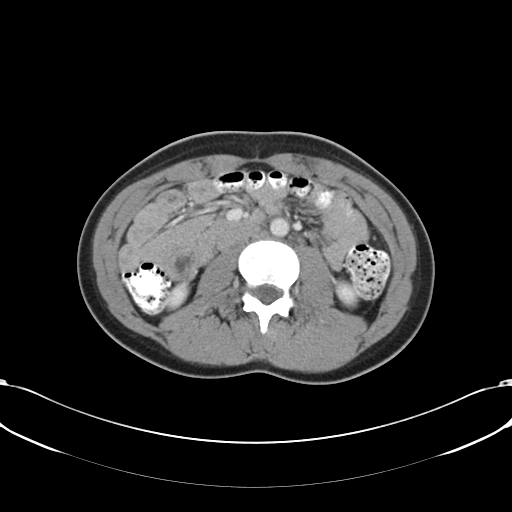
[im 55/94  bone]
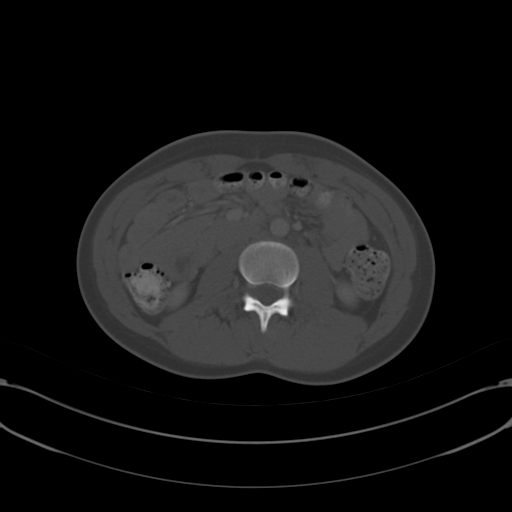
[im 63/94  soft-tissue]
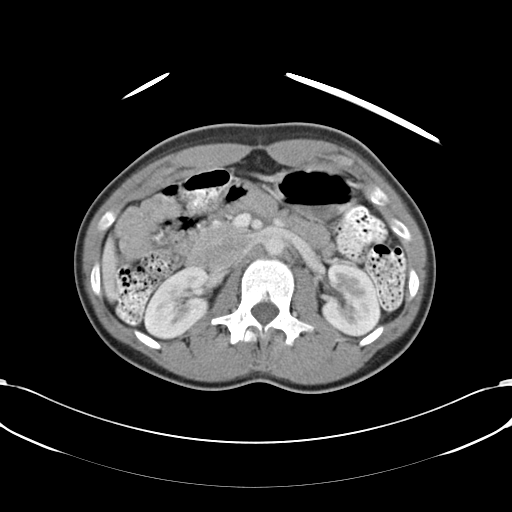
[im 70/94  soft-tissue]
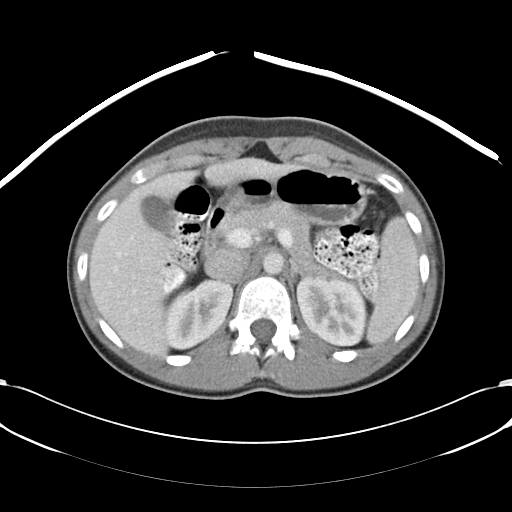
[im 74/94  soft-tissue]
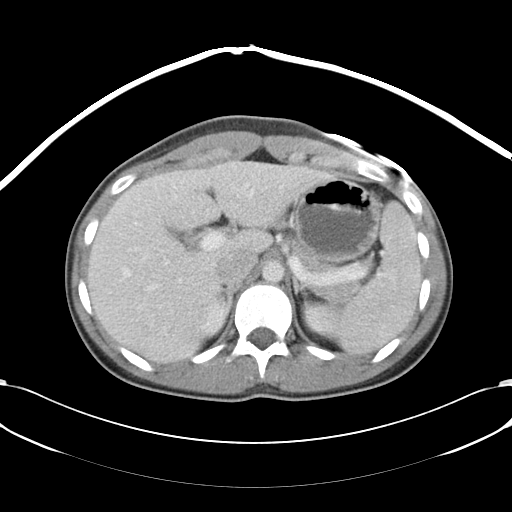
[im 82/94  soft-tissue]
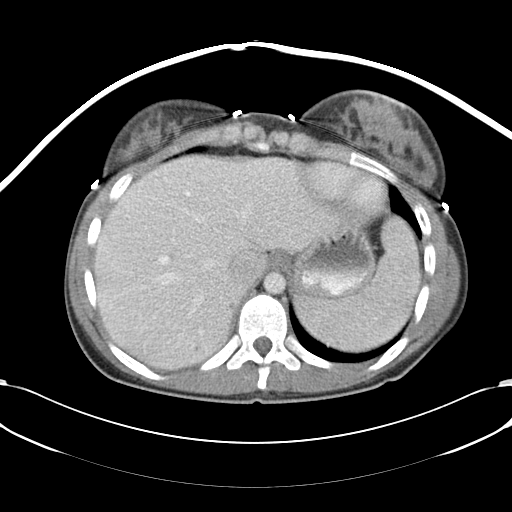
[im 90/94  soft-tissue]
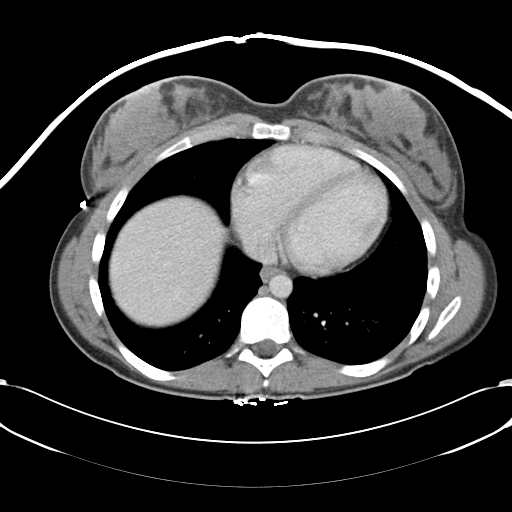

[Series 602: <mpr range> · coronal · 0.94mm/px · 3 of 99 slices shown]
[im 33/99  soft-tissue]
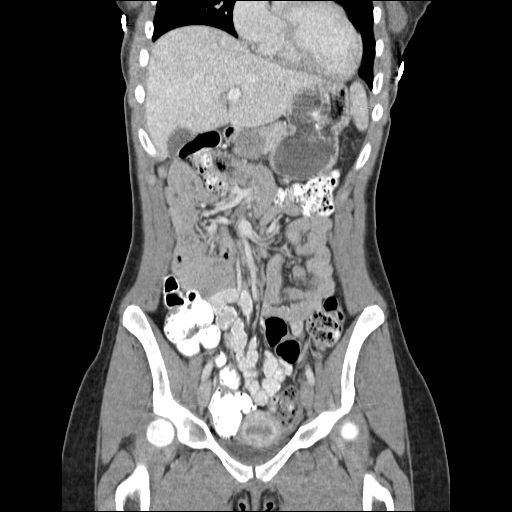
[im 44/99  soft-tissue]
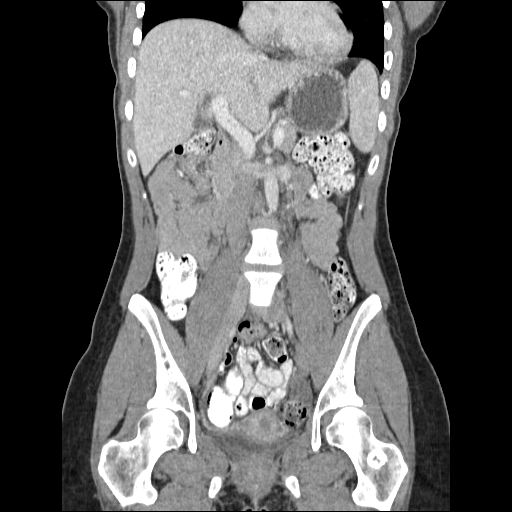
[im 55/99  soft-tissue]
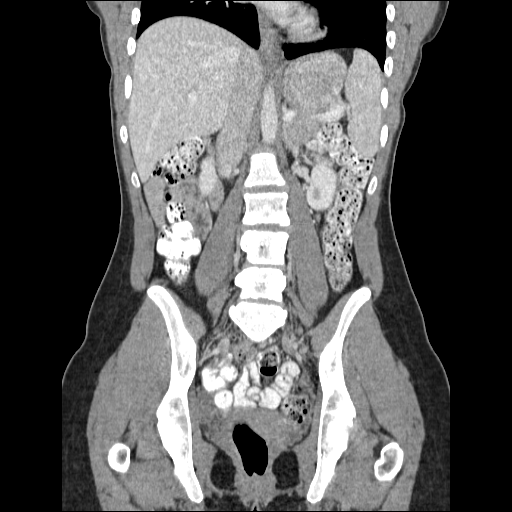

[17 of 46 positions shown; findings below may reference images not displayed]

FINDINGS: Lung bases clear.
Spleen, pancreas, kidneys, and adrenal glands normal appearance.
Scattered tiny low attenuation foci within liver, nonspecific
appearance, largest anteriorly in lateral segment left lobe liver
10 x 6 mm, image 20.
No mass, adenopathy, free fluid or inflammatory process.
Stomach and bowel loops unremarkable.
Normal-appearing appendix, decompressed bladder and adnexae.
Low attenuation within uterus question related to phase of menses.
No acute bony abnormalities.
IMPRESSION: No acute intra abdominal abnormalities.
Tiny nonspecific low attenuation foci within liver, majority too
small to characterize.
Small sizes of these lesions would make these difficult to attempt
characterization by ultrasound or CT.
If clinically indicated, consider follow-up CT imaging in 4-6
months to confirm stability.

## 2010-10-29 ENCOUNTER — Ambulatory Visit: Payer: Self-pay | Admitting: Family Medicine

## 2011-01-28 ENCOUNTER — Other Ambulatory Visit: Payer: Self-pay | Admitting: Internal Medicine

## 2011-02-23 ENCOUNTER — Telehealth: Payer: Self-pay | Admitting: Internal Medicine

## 2011-02-23 NOTE — Telephone Encounter (Signed)
Left a message for patient's mother to call back. 

## 2011-02-24 NOTE — Telephone Encounter (Signed)
Left a message for patient's mother to call back. 

## 2011-02-25 NOTE — Telephone Encounter (Signed)
Spoke with patient's mother and she states the patient is having the "same problems she had last year." She states she is having reflux and has to eat like an old woman. She wants to schedule an EGD. Discussed with her that it has been a year since her last visit and by the last telephone note on 03/22/09 patient would need to see physician to discuss. Scheduled patient to see Dr. Juanda Chance on 03/02/11 at 10:45 AM.

## 2011-02-25 NOTE — Telephone Encounter (Signed)
I don't see where I have seen her in the office. 03/2009 colon and CT scan of the abdomen ruled out Crohn's disease. I will see her in the office. Please call her to take OTC Prilosec 20mg  qd till she sees me.

## 2011-02-25 NOTE — Telephone Encounter (Signed)
Spoke with patient's mother and gave her Dr. Regino Schultze recommendation. She states her daughter has tried this.

## 2011-02-26 ENCOUNTER — Encounter: Payer: Self-pay | Admitting: *Deleted

## 2011-03-03 ENCOUNTER — Ambulatory Visit (INDEPENDENT_AMBULATORY_CARE_PROVIDER_SITE_OTHER): Payer: 59 | Admitting: Internal Medicine

## 2011-03-03 ENCOUNTER — Encounter: Payer: Self-pay | Admitting: Internal Medicine

## 2011-03-03 DIAGNOSIS — K3189 Other diseases of stomach and duodenum: Secondary | ICD-10-CM

## 2011-03-03 DIAGNOSIS — K219 Gastro-esophageal reflux disease without esophagitis: Secondary | ICD-10-CM

## 2011-03-03 DIAGNOSIS — R1013 Epigastric pain: Secondary | ICD-10-CM

## 2011-03-03 MED ORDER — PANTOPRAZOLE SODIUM 40 MG PO TBEC: 40.0000 mg | DELAYED_RELEASE_TABLET | Freq: Every day | ORAL | Status: DC

## 2011-03-03 NOTE — Patient Instructions (Signed)
We have sent the following medications to your pharmacy for you to pick up at your convenience: Protonix 40 mg daily. You have been scheduled for an endoscopy. Please follow written instructions given to you at your visit today. You have been scheduled for an abdominal ultrasound at [facility] (1st floor of hospital) on 03/23/11 at 8:30 am. Please arrive 15 minutes prior to your appointment for registration. Make certain not to have anything to eat or drink 6 hours prior to your appointment. Should you need to reschedule your appointment, please contact radiology at [phone]. CC: Dr Fabian Sharp

## 2011-03-03 NOTE — Progress Notes (Signed)
Autumn Castillo [birth year] MRN [id number]        History of Present Illness:  This is a 20 year old white female senior in college, comes today with the chronic gastroesophageal reflux and burning substernally especially within 45-60 minutes postprandially. She also wakes up at night coughing. She has a asthmatic bronchitis especially when she runs. She has occasional regurgitation and vomiting. She has tried over-the-counter antacids as well as Prilosec 20 mg for 2 weeks without significant improvement of her symptoms. She denies  dysphagia or odynophagia. She was checked for sprue in January 2011 when she presented with right lower quadrant abdominal pain. Colonoscopy including terminal ileum were normal. And her tissue transglutaminase was negative. All blood work was normal including TSH and a sedimentation rate. She has been on Adderall 30 mg daily. CT scan of the abdomen in January 2011 show no active disease.    Past Medical History  Diagnosis Date  . Hemorrhoids   . ADHD (attention deficit hyperactivity disorder)    No past surgical history on file.  does not have a smoking history on file. She does not have any smokeless tobacco history on file. She reports that she drinks alcohol. She reports that she does not use illicit drugs. family history includes Gallbladder disease in her paternal grandfather.  There is no history of Colon cancer. Not on File      Review of Systems: Positive for burning sensation and substernally as well as in the epigastrium. Positive for nausea. Vomiting. Weight has been stable  The remainder of the 10 point ROS is negative except as outlined in H&P   Physical Exam: General appearance  Well developed, in no distress. Eyes- non icteric. HEENT nontraumatic, normocephalic. Mouth no lesions, tongue papillated, no cheilosis. Neck supple without adenopathy, thyroid not enlarged, no carotid bruits, no JVD. Lungs Clear to auscultation  bilaterally. Cor normal S1, normal S2, regular rhythm, no murmur,  quiet precordium. Abdomen: Soft nontender with normal active bowel sounds. No distention. Liver edge at costal margin Rectal: Not done Extremities no pedal edema. Skin no lesions. Neurological alert and oriented x 3. Psychological normal mood and affect.  Assessment and Plan:  Symptoms of gastroesophageal reflux or esophageal spasm.in the presence of asthmatic bronchitis. The reflux may lead to LPR and wheezing and vice versa. I recommended for her to see an asthma specialists. We will proceed with upper endoscopy and biopsies to rule out Barrett's esophagus. We will also start Nexium 40 mg daily. We will also proceed with upper abdominal ultrasound because of family history of gallbladder disease in her grandfather postprandial abd. Pain and early satiety.   03/03/2011 Autumn Castillo

## 2011-03-19 ENCOUNTER — Encounter: Payer: Self-pay | Admitting: Internal Medicine

## 2011-03-19 ENCOUNTER — Ambulatory Visit (AMBULATORY_SURGERY_CENTER): Payer: 59 | Admitting: Internal Medicine

## 2011-03-19 DIAGNOSIS — K3189 Other diseases of stomach and duodenum: Secondary | ICD-10-CM

## 2011-03-19 DIAGNOSIS — R1013 Epigastric pain: Secondary | ICD-10-CM

## 2011-03-19 DIAGNOSIS — K219 Gastro-esophageal reflux disease without esophagitis: Secondary | ICD-10-CM

## 2011-03-19 DIAGNOSIS — K2940 Chronic atrophic gastritis without bleeding: Secondary | ICD-10-CM

## 2011-03-19 MED ORDER — SODIUM CHLORIDE 0.9 % IV SOLN: 500.0000 mL | INTRAVENOUS | Status: DC

## 2011-03-19 NOTE — Patient Instructions (Signed)
Discharge instructions given with verbal understanding. Biopsies taken. Resume previous medications. Office will schedule abdominal ultrasound.

## 2011-03-19 NOTE — Progress Notes (Signed)
Patient did not experience any of the following events: a burn prior to discharge; a fall within the facility; wrong site/side/patient/procedure/implant event; or a hospital transfer or hospital admission upon discharge from the facility. [phone]) Patient did not have preoperative order for IV antibiotic SSI prophylaxis. [phone])  [place] on Friday 04-10-2011 at 2;30pm. Nothing to eat or drink 6 hours prior to test.

## 2011-03-19 NOTE — Op Note (Signed)
[facility] [address] [place], [place]  [address]  ENDOSCOPY PROCEDURE REPORT  PATIENT:  Autumn, Castillo  MR#:  [id number] BIRTHDATE:  01-30-1991, 20 yrs. old  GENDER:  female  ENDOSCOPIST:  Autumn Morton. Juanda Chance, MD Referred by:  Neta Mends. Panosh, M.D.  PROCEDURE DATE:  03/19/2011 PROCEDURE:  EGD with biopsy, 43239 ASA CLASS:  Class I INDICATIONS:  nausea and vomiting, abdominal pain normal CT scan of the abd. 2011, Nexiem not effective  MEDICATIONS:   These medications were titrated to patient response per physician's verbal order, Versed 7 mg, Fentanyl 75 mcg TOPICAL ANESTHETIC:  Cetacaine Spray  DESCRIPTION OF PROCEDURE:   After the risks benefits and alternatives of the procedure were thoroughly explained, informed consent was obtained.  The LB GIF-H180 [id number] endoscope was introduced through the mouth and advanced to the second portion of the duodenum, without limitations.  The instrument was slowly withdrawn as the mucosa was fully examined. <<PROCEDUREIMAGES>>  Normal GE junction was noted. With standard forceps, a biopsy was obtained and sent to pathology (see image7 and image8).  The upper, middle, and distal third of the esophagus were carefully inspected and no abnormalities were noted. The z-line was well seen at the GEJ. The endoscope was pushed into the fundus which was normal including a retroflexed view. The antrum,gastric body, first and second part of the duodenum were unremarkable. With standard forceps, a biopsy was obtained and sent to pathology. antral biopsy to r/o H (see image6, image5, image4, image3, and image2).Pylori    Retroflexed views revealed no abnormalities. The scope was then withdrawn from the patient and the procedure completed.  COMPLICATIONS:  None  ENDOSCOPIC IMPRESSION: 1) Normal GE junction 2) Normal EGD s/p biopsies g-e junction and gastric antrum RECOMMENDATIONS: 1) Await biopsy results continue Nexiem 40 mg  qd. schedule abd. ultrasound  REPEAT EXAM:  In 0 year(s) for.  ______________________________ Autumn Morton. Juanda Chance, MD  CC:  n. eSIGNED:   Hedwig Morton. Latanya Castillo at 03/19/2011 03:42 PM  Autumn Castillo, [id number]

## 2011-03-20 ENCOUNTER — Telehealth: Payer: Self-pay | Admitting: *Deleted

## 2011-03-20 NOTE — Telephone Encounter (Signed)
No answer, number left to call was for Autumn Castillo, patient's mother, mailbox for cell phone is full and unable to accept messages at this time. No message left.

## 2011-03-23 ENCOUNTER — Other Ambulatory Visit (HOSPITAL_COMMUNITY): Payer: 59

## 2011-03-24 ENCOUNTER — Encounter: Payer: Self-pay | Admitting: Internal Medicine

## 2011-04-03 ENCOUNTER — Institutional Professional Consult (permissible substitution): Payer: 59 | Admitting: Internal Medicine

## 2011-04-07 ENCOUNTER — Encounter: Payer: Self-pay | Admitting: Internal Medicine

## 2011-04-10 ENCOUNTER — Ambulatory Visit (INDEPENDENT_AMBULATORY_CARE_PROVIDER_SITE_OTHER): Payer: 59 | Admitting: Internal Medicine

## 2011-04-10 ENCOUNTER — Encounter: Payer: Self-pay | Admitting: Internal Medicine

## 2011-04-10 ENCOUNTER — Ambulatory Visit (HOSPITAL_COMMUNITY)
Admission: RE | Admit: 2011-04-10 | Discharge: 2011-04-10 | Disposition: A | Discharge status: Home / Self Care | Payer: 59 | Source: Ambulatory Visit | Attending: Internal Medicine | Admitting: Internal Medicine

## 2011-04-10 VITALS — BP 100/60 | HR 74 | Ht 64.0 in | Wt 147.4 lb

## 2011-04-10 DIAGNOSIS — J45990 Exercise induced bronchospasm: Secondary | ICD-10-CM

## 2011-04-10 DIAGNOSIS — R0989 Other specified symptoms and signs involving the circulatory and respiratory systems: Secondary | ICD-10-CM

## 2011-04-10 DIAGNOSIS — K219 Gastro-esophageal reflux disease without esophagitis: Secondary | ICD-10-CM

## 2011-04-10 DIAGNOSIS — R1013 Epigastric pain: Secondary | ICD-10-CM

## 2011-04-10 NOTE — Progress Notes (Signed)
04/10/11- 20 yoF never smoker self-referred for evaluation of wheezing and shortness of breath. Mother is here.PCP Dr Fabian Sharp Since sometime in her late teens, she has been aware of shortness of breath with some lightheadedness, cough and sneeze after long runs. She considers herself fit and does exercise regularly. These symptoms first developed when she was still playing soccer. She has not been told of a diagnosis of asthma in herself or family. Allergy skin testing elsewhere 2 years ago was negative and she denies a history of seasonal rhinitis symptoms or sinus infection. She does have a history of GERD/ Dr Juanda Chance and occasionally wakes choking followed by hoarseness. She snores intermittently. There is no history of heart disease. Her only ENT surgery was wisdom teeth.   She denies chest pain, palpitation, stridor, leg swelling. Senior at [employer] in [employer], living with room mates. On BCPs and on Adderall for ADD. ROS-see HPI Constitutional:   No-   weight loss, night sweats, fevers, chills, fatigue, lassitude. HEENT:   No-  headaches, difficulty swallowing, tooth/dental problems, sore throat,       No-  sneezing, itching, ear ache, nasal congestion, post nasal drip,  CV:  No-   chest pain, orthopnea, PND, swelling in lower extremities, anasarca,, palpitations, + some exertional dizziness Resp: No-   shortness of breath with exertion or at rest.              No-   productive cough,  + non-productive cough -exertional,  No- coughing up of blood.              No-   change in color of mucus.  No- wheezing.   Skin: No-   rash or lesions. GI:  +heartburn, indigestion,  No-abdominal pain, nausea, vomiting, diarrhea,                 change in bowel habits, loss of appetite GU: MS:  No-   joint pain or swelling.  No- decreased range of motion.  No- back pain. Neuro-     nothing unusual Psych:  No- change in mood or affect. No depression or anxiety.  No memory loss.  OBJ General- Alert,  Oriented, Affect-appropriate, very direct personality, Distress- none acute, WDWN Skin- rash-none, lesions- none, excoriation- none Lymphadenopathy- none Head- atraumatic            Eyes- Gross vision intact, PERRLA, conjunctivae clear secretions            Ears- Hearing, canals-normal            Nose- Clear, no-Septal dev, mucus, polyps, erosion, perforation             Throat- Mallampati II , mucosa clear , drainage- none, tonsils- atrophic Neck- flexible , trachea midline, no stridor , thyroid nl, carotid no bruit Chest - symmetrical excursion , unlabored           Heart/CV- RRR , no murmur , no gallop  , no rub, nl s1 s2                           - JVD- none , edema- none, stasis changes- none, varices- none           Lung- clear to P&A, wheeze- none, cough- none , dullness-none, rub- none           Chest wall-  Abd- tender-no, distended-no, bowel sounds-present, HSM- no Br/ Gen/ Rectal- Not done, not indicated Extrem-  cyanosis- none, clubbing, none, atrophy- none, strength- nl Neuro- grossly intact to observation

## 2011-04-10 NOTE — Patient Instructions (Signed)
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 This can reduce tendency to reflux at night while you are sleeping.

## 2011-04-12 NOTE — Assessment & Plan Note (Signed)
Medical issues were discussed. Plan-return to Dr. Juanda Chance as needed, do not put up with recurring symptoms. Elevate head of bed on brick.

## 2011-04-12 NOTE — Assessment & Plan Note (Addendum)
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 I get the feeling she pushes herself pretty hard with exercise and this may interact some with her Adderall and her tendency to reflux. I doubt pulmonary hypertension, VTE on BCPs, or cardiac dysfunction such as hypertrophic cardiomyopathy. Plan-try Xopenex HFA as a rescue inhaler, especially before exercise. This was discussed and technique was demonstrated.

## 2011-04-30 ENCOUNTER — Other Ambulatory Visit: Payer: Self-pay | Admitting: Internal Medicine

## 2011-05-01 NOTE — Telephone Encounter (Signed)
Letter sent to pt

## 2011-05-08 ENCOUNTER — Other Ambulatory Visit (HOSPITAL_COMMUNITY): Payer: 59

## 2011-05-08 ENCOUNTER — Telehealth: Payer: Self-pay | Admitting: Internal Medicine

## 2011-05-08 NOTE — Telephone Encounter (Signed)
Patient's mother states that patient was stuck and unable to come for ultrasound appointment. I have given her the number to radiology to call back and reschedule the appointment.

## 2011-05-15 ENCOUNTER — Ambulatory Visit: Payer: 59 | Admitting: Internal Medicine

## 2011-05-29 ENCOUNTER — Ambulatory Visit: Payer: 59 | Admitting: Internal Medicine

## 2011-06-19 ENCOUNTER — Ambulatory Visit: Payer: 59 | Admitting: Internal Medicine

## 2011-07-31 ENCOUNTER — Ambulatory Visit: Payer: 59 | Admitting: Internal Medicine

## 2011-09-09 ENCOUNTER — Telehealth: Payer: Self-pay | Admitting: Internal Medicine

## 2011-09-09 NOTE — Telephone Encounter (Signed)
Caller: Patty/Mother; PCP: Madelin Headings.; CB#: [phone]; ; ; Call regarding Malaria & Cipro RX; Health Dept informed Mom, Cipro should be taken incase of Diarrhea. Family scheduled to leave for [place] on 7-16, Pt will leave for [place] prior to [place] on 6-28.  Mom requesting Malaria & Cipro RX for PT to be sent to [facility], [place].  Please f/u w/ Mom.

## 2011-09-09 NOTE — Telephone Encounter (Signed)
Need more information about the malaria   meds. Which anti malarial medication was advised and how many days of travel( there are a number of options depending on where she is traveling.)  Please call pt and mom about this so we can fulfill the request.   Cipro 500 1 po bid for 3 days  If needed for travelers diarrhea.cn be sent in .

## 2011-09-10 NOTE — Telephone Encounter (Signed)
Ok to do th is and print them out for her  For mom to pick up.  On Friday .

## 2011-09-10 NOTE — Telephone Encounter (Signed)
Spoke to the pt.  The health department has given Clayborne Dana (mom), dad and brother the medications.  Autumn Castillo is the only one who needs them.  The health department has advised and given  Chloroquine phospate 250mg , two tabs twice weekly for 7 weeks Cipro 500mg  twice daily for 5 days.  Clayborne Dana would like to pick these up on 09/11/11.  Please advise.

## 2011-09-11 ENCOUNTER — Other Ambulatory Visit: Payer: Self-pay | Admitting: Family Medicine

## 2011-09-11 MED ORDER — CIPROFLOXACIN HCL 500 MG PO TABS: ORAL_TABLET | ORAL | Status: DC

## 2011-09-11 MED ORDER — CHLOROQUINE PHOSPHATE 250 MG PO TABS: ORAL_TABLET | ORAL | Status: DC

## 2011-09-11 NOTE — Telephone Encounter (Signed)
Sent to [facility] pharmacy per the pt.'s mother.

## 2011-09-21 ENCOUNTER — Other Ambulatory Visit: Payer: Self-pay | Admitting: Family Medicine

## 2011-09-21 ENCOUNTER — Telehealth: Payer: Self-pay | Admitting: Internal Medicine

## 2011-09-21 MED ORDER — CHLOROQUINE PHOSPHATE 250 MG PO TABS: ORAL_TABLET | ORAL | Status: DC

## 2011-09-21 MED ORDER — CIPROFLOXACIN HCL 500 MG PO TABS: ORAL_TABLET | ORAL | Status: DC

## 2011-09-21 NOTE — Telephone Encounter (Signed)
Pt notified that rx was sent to [facility] in [place]

## 2011-09-21 NOTE — Telephone Encounter (Signed)
The following [facility] request. Pt is out of medication and is requesting to have rx resent to [facility]   chloroquine (ARALEN) 250 MG tablet    ciprofloxacin (CIPRO) 500 MG tablet    Please call [phone] when called in

## 2011-10-26 ENCOUNTER — Other Ambulatory Visit: Payer: Self-pay | Admitting: Internal Medicine

## 2011-12-21 ENCOUNTER — Other Ambulatory Visit: Payer: Self-pay | Admitting: Internal Medicine

## 2012-04-12 ENCOUNTER — Other Ambulatory Visit: Payer: Self-pay | Admitting: Internal Medicine

## 2012-06-25 ENCOUNTER — Other Ambulatory Visit: Payer: Self-pay | Admitting: Internal Medicine

## 2012-11-07 ENCOUNTER — Telehealth: Payer: Self-pay | Admitting: Internal Medicine

## 2012-11-07 NOTE — Telephone Encounter (Signed)
Patient not seen in many years.  She will need to re-establish with [facility] before any rx will be given.  Please make appt with the pt.  Thanks!!

## 2012-11-07 NOTE — Telephone Encounter (Signed)
Pt will be traveling to [place] in oct. Would like to know if there is any way she can test her RX prior to leaving [place]oqu[place]  (Anti malaria)  Pt has gone to health dept and they advised pt to call pcp.

## 2012-11-09 ENCOUNTER — Other Ambulatory Visit: Payer: Self-pay | Admitting: Internal Medicine

## 2012-11-09 NOTE — Telephone Encounter (Signed)
Pt aware she need to make new pt OV w/ Dr Fabian Sharp. Pt understood and states she will call back.

## 2012-11-18 NOTE — Telephone Encounter (Signed)
Pt will call back if she changes her mind about making an appt.Marland Kitchen

## 2012-12-09 ENCOUNTER — Telehealth: Payer: Self-pay | Admitting: Internal Medicine

## 2012-12-09 ENCOUNTER — Telehealth: Payer: Self-pay | Admitting: *Deleted

## 2012-12-09 NOTE — Telephone Encounter (Signed)
LMOM for patient to call office back  Patient needs an office visit for further refills

## 2012-12-09 NOTE — Telephone Encounter (Signed)
Patient called back Patient stated that she is going to [place] for six months and needed a RX for Protonix to get her through the six months she is in [place]. Patient is leaving on December 18, 2012 and needs appointment with Dr. Juanda Chance before she goes because she has not been seen since 2012. Per Lavonna Rua ok to put patient on with an extender. Patient was transferred to Tonian[facility] Fail to make an appointment. Appointment is made for 12-13-2012 with Amy Esterwood.

## 2012-12-13 ENCOUNTER — Encounter: Payer: Self-pay | Admitting: Physician Assistant

## 2012-12-13 ENCOUNTER — Ambulatory Visit (INDEPENDENT_AMBULATORY_CARE_PROVIDER_SITE_OTHER): Payer: 59 | Admitting: Physician Assistant

## 2012-12-13 ENCOUNTER — Other Ambulatory Visit: Payer: Self-pay | Admitting: *Deleted

## 2012-12-13 VITALS — BP 100/60 | HR 60 | Ht 64.0 in | Wt 147.5 lb

## 2012-12-13 DIAGNOSIS — K219 Gastro-esophageal reflux disease without esophagitis: Secondary | ICD-10-CM

## 2012-12-13 MED ORDER — PANTOPRAZOLE SODIUM 40 MG PO TBEC: DELAYED_RELEASE_TABLET | ORAL | Status: DC

## 2012-12-13 MED ORDER — PANTOPRAZOLE SODIUM 40 MG PO TBEC: 40.0000 mg | DELAYED_RELEASE_TABLET | Freq: Every day | ORAL | Status: DC

## 2012-12-13 NOTE — Progress Notes (Signed)
  Subjective:    Patient ID: Autumn Castillo, female    DOB: [birth year], 22 y.o.   MRN: [id number]  HPI  Autumn Castillo is a pleasant 22 year old white female known to Dr. Juanda Chance. She has been followed for GERD, and comes in today for prescription refill. She states she is been doing well and is low she takes her Prilosec has no problems with heartburn indigestion dysphagia or odynophagia or abdominal pain. 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   Review of Systems  Constitutional: Negative.   HENT: Negative.   Eyes: Negative.   Respiratory: Negative.   Cardiovascular: Negative.   Gastrointestinal: Negative.   Endocrine: Negative.   Genitourinary: Negative.   Musculoskeletal: Negative.   Skin: Negative.   Allergic/Immunologic: Negative.   Neurological: Negative.   Hematological: Negative.   Psychiatric/Behavioral: Negative.    Outpatient Prescriptions Prior to Visit  Medication Sig Dispense Refill  . pantoprazole (PROTONIX) 40 MG tablet TAKE ONE TABLET BY MOUTH ONCE DAILY  30 tablet  2  . amphetamine-dextroamphetamine (ADDERALL) 30 MG tablet Take 30 mg by mouth daily.        . chloroquine (ARALEN) 250 MG tablet Take 2 tabs twice weekly for seven weeks  28 tablet  0  . ciprofloxacin (CIPRO) 500 MG tablet Take twice daily for five days.  10 tablet  0  . JOLESSA 0.15-0.03 MG tablet TAKE AS DIRECTED. NEED OFFICE VISIT  91 each  0  . lisdexamfetamine (VYVANSE) 40 MG capsule Take 40 mg by mouth every morning.      Marland Kitchen 0.9 %  sodium chloride infusion        No facility-administered medications prior to visit.   No Known Allergies Patient Active Problem List   Diagnosis Date Noted  . Dyspnea on exertion 04/12/2011  . IRREGULAR MENSTRUAL CYCLE 07/03/2009  . ADD 02/26/2009  . 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 GROSS HEMATURIA 07/13/2008  . GERD (gastroesophageal reflux disease) 06/13/2008  . ABDOMINAL PAIN, EPIGASTRIC 03/21/2008  . ACUTE  PHARYNGITIS 08/12/2007  . FATIGUE 08/12/2007  . HEMATURIA UNSPECIFIED 05/24/2007  . ACNE, MILD 05/24/2007   History  Substance Use Topics  . Smoking status: Never Smoker   . Smokeless tobacco: Never Used  . Alcohol Use: Yes     Comment: occasional   family history includes Allergies in an other family member; Breast cancer in an other family member; Diabetes in her maternal grandmother; Gallbladder disease in her paternal grandfather; Heart disease in an other family member; Prostate cancer in an other family member; Skin cancer in an other family member. There is no history of Colon cancer.     Objective:   Physical Exam  well-developed young white female in no acute distress blood pressure 100/60 pulse 60 height 5 foot 4 weight 147. HEENT; nontraumatic normocephalic EOMI PERRLA sclera anicteric, Supple; no JVD, Cardiovascular; regular rate and rhythm with S1-S2 no murmur or gallop, capillary clear bilaterally, Abdomen; soft nontender nondistended bowel sounds are active there is no palpable mass or hepatosplenomegaly, Extremities; no clubbing cyanosis or edema skin warm and dry, Psych; mood and affect normal and appropriate     ASSESSMENT/PLAN;   #71  22 year old female with chronic GERD/dyspepsia-well controlled on Protonix  Plan; refill Protonix 40 mg by mouth every morning x1 year Patient will follow up with Dr. Juanda Chance as needed

## 2012-12-13 NOTE — Progress Notes (Signed)
Reviewed and agree.

## 2012-12-13 NOTE — Patient Instructions (Addendum)
We sent a prescription for refills to [facility]   Protonix 40 mg , Take 1 tab in the Am.  We sent 11 refills and gave you a Protonix savings card.

## 2012-12-16 ENCOUNTER — Telehealth: Payer: Self-pay | Admitting: Physician Assistant

## 2012-12-19 ENCOUNTER — Other Ambulatory Visit: Payer: Self-pay | Admitting: *Deleted

## 2012-12-19 ENCOUNTER — Telehealth: Payer: Self-pay | Admitting: *Deleted

## 2012-12-19 MED ORDER — PANTOPRAZOLE SODIUM 40 MG PO TBEC: DELAYED_RELEASE_TABLET | ORAL | Status: DC

## 2012-12-19 NOTE — Telephone Encounter (Signed)
I called to let the patient know we had done 11 refills for her, knowing she was leaving the country.  I tried to call both numbers and the voice mails not set up yet on 1 number and the other number's voice mail is full.

## 2012-12-20 ENCOUNTER — Other Ambulatory Visit: Payer: Self-pay | Admitting: *Deleted

## 2012-12-20 MED ORDER — PANTOPRAZOLE SODIUM 40 MG PO TBEC: DELAYED_RELEASE_TABLET | ORAL | Status: DC

## 2013-01-13 ENCOUNTER — Ambulatory Visit: Payer: 59 | Admitting: Internal Medicine

## 2013-01-19 NOTE — Telephone Encounter (Signed)
See note from 12-19-12 °

## 2013-10-24 ENCOUNTER — Encounter: Payer: Self-pay | Admitting: Internal Medicine

## 2014-01-29 ENCOUNTER — Other Ambulatory Visit: Payer: Self-pay | Admitting: Physician Assistant

## 2014-04-13 ENCOUNTER — Telehealth: Payer: Self-pay | Admitting: *Deleted

## 2014-04-13 ENCOUNTER — Telehealth: Payer: Self-pay | Admitting: Physician Assistant

## 2014-04-13 MED ORDER — PANTOPRAZOLE SODIUM 40 MG PO TBEC: 40.0000 mg | DELAYED_RELEASE_TABLET | Freq: Every day | ORAL | Status: DC

## 2014-04-13 NOTE — Telephone Encounter (Signed)
[name] [employer] [place] [place]  I told her that she said she will be home early March for a short time and will have her Mom make an appointment when she comes home.  I told her that we can only do 1 refill  And she asked could she have enough until early March. I told her yes.  If she doesn't come home for an appt we won't refill it until she does come back here.

## 2014-04-13 NOTE — Telephone Encounter (Signed)
Evonda asked me to sent the prescription to [facility], [address], [place], [place].  Phone is [phone].

## 2014-05-24 ENCOUNTER — Telehealth: Payer: Self-pay | Admitting: Internal Medicine

## 2014-05-24 MED ORDER — PANTOPRAZOLE SODIUM 40 MG PO TBEC: 40.0000 mg | DELAYED_RELEASE_TABLET | Freq: Every day | ORAL | Status: DC

## 2014-05-24 NOTE — Telephone Encounter (Signed)
Sent Rx for pantoprazole, 40 mg, #30 with two refills to [facility]. Patient has appointment with Dr. Juanda ChanceBrodie on 07/17/14 at 8:15 am. Patient needs to keep appointment for future refills.

## 2014-06-01 NOTE — Telephone Encounter (Signed)
rx done on another encounter

## 2014-07-17 ENCOUNTER — Ambulatory Visit (INDEPENDENT_AMBULATORY_CARE_PROVIDER_SITE_OTHER): Payer: 59 | Admitting: Internal Medicine

## 2014-07-17 ENCOUNTER — Encounter: Payer: Self-pay | Admitting: Internal Medicine

## 2014-07-17 VITALS — BP 100/56 | HR 64 | Ht 64.0 in | Wt 142.0 lb

## 2014-07-17 DIAGNOSIS — K219 Gastro-esophageal reflux disease without esophagitis: Secondary | ICD-10-CM | POA: Diagnosis not present

## 2014-07-17 MED ORDER — PANTOPRAZOLE SODIUM 40 MG PO TBEC: 40.0000 mg | DELAYED_RELEASE_TABLET | Freq: Every day | ORAL | Status: DC

## 2014-07-17 NOTE — Patient Instructions (Addendum)
We have sent the following medications to your pharmacy for you to pick up at your convenience: pantoprazole  Dr Fabian SharpPanosh

## 2014-07-17 NOTE — Progress Notes (Signed)
Autumn Castillo 05/19/1990 [id number]  Note: This dictation was prepared with Dragon digital system. Any transcriptional errors that result from this procedure are unintentional.   History of Present Illness: This is a 24 year old patient of Dr. Fabian SharpPanosh with gastroesophageal reflux disease, last appointment in September 2014. She has asthmatic bronchitis and chronic cough. She had complete GI evaluation in 2011 which included normal colonoscopy to terminal ileum. Negative CT scan of the abdomen. Normal upper endoscopy in January 2013 and negative upper abdominal ultrasound in May 2010. Her sprue profile has been negative. She comes for refill on pantoprazole. She has occasional breakthrough heartburn but most of the time pantoprazole works very well. She denies dysphagia or nocturnal cough. Her asthmatic bronchitis has been under good control    Past Medical History  Diagnosis Date  . Hemorrhoids   . ADHD (attention deficit hyperactivity disorder)   . Reflux   . GERD (gastroesophageal reflux disease)   . Osgood-Schlatter's disease   . Acne     Past Surgical History  Procedure Laterality Date  . Wisdom tooth extraction    . Colonoscopy      No Known Allergies  Family history and social history have been reviewed.  Review of Systems: Negative for dysphagia chest pain vomiting  The remainder of the 10 point ROS is negative except as outlined in the H&P  Physical Exam: General Appearance Well developed, in no distress Eyes  Non icteric  HEENT  Non traumatic, normocephalic  Mouth No lesion, tongue papillated, no cheilosis Neck Supple without adenopathy, thyroid not enlarged, no carotid bruits, no JVD Lungs Clear to auscultation bilaterally COR Normal S1, normal S2, regular rhythm, no murmur, quiet precordium Abdomen soft relaxed abdomen with normoactive bowel sounds. Mild tenderness left middle quadrant.  Rectal not done Extremities  No pedal edema Skin No  lesions Neurological Alert and oriented x 3 Psychological Normal mood and affect  Assessment and Plan:   24 year old white female with gastroesophageal reflux controlled on pantoprazole. We will refill her medications for one year. She will continue antireflux measures    Lina SarDora Johniece Hornbaker 07/17/2014

## 2014-09-03 ENCOUNTER — Other Ambulatory Visit: Payer: Self-pay | Admitting: Internal Medicine

## 2015-01-10 ENCOUNTER — Encounter: Payer: Self-pay | Admitting: Gastroenterology

## 2015-01-10 ENCOUNTER — Other Ambulatory Visit: Payer: Self-pay | Admitting: Gastroenterology

## 2015-01-10 MED ORDER — PANTOPRAZOLE SODIUM 40 MG PO TBEC: 40.0000 mg | DELAYED_RELEASE_TABLET | Freq: Every day | ORAL | Status: DC

## 2015-01-10 NOTE — Telephone Encounter (Signed)
Patient was seen 07/17/14 by Dr Juanda ChanceBrodie and at that time was told patient to be given year supply of medication. Request came in for medication and orders are to fill patients medication under the doc of the day. Medication refilled for the rest of the year's supply.

## 2015-04-18 ENCOUNTER — Telehealth: Payer: Self-pay | Admitting: Internal Medicine

## 2015-04-18 NOTE — Telephone Encounter (Signed)
Talked to patient and she said she is Dr. Fabian Sharp patient and Dr. Fabian Sharp wrote the prescription.  She said she will go home tonight and look for some proof and call back tomorrow.

## 2015-04-18 NOTE — Telephone Encounter (Signed)
Pt not seen here since 2011.  She is not a pt here.  Not sure who is prescribing this medication.  Please have her call the correct office.

## 2015-04-18 NOTE — Telephone Encounter (Signed)
Noted  

## 2015-04-18 NOTE — Telephone Encounter (Signed)
Patient is trying to get refills on her Spironolactone medication but the pharmacy ([facility] [place] ) said she does not have anymore refills, but the patient said it should be refills on this prescription.

## 2015-09-04 ENCOUNTER — Other Ambulatory Visit: Payer: Self-pay | Admitting: Gastroenterology

## 2015-09-18 ENCOUNTER — Ambulatory Visit (INDEPENDENT_AMBULATORY_CARE_PROVIDER_SITE_OTHER): Payer: 59 | Admitting: Gastroenterology

## 2015-09-18 ENCOUNTER — Encounter: Payer: Self-pay | Admitting: Gastroenterology

## 2015-09-18 VITALS — BP 90/60 | HR 84 | Ht 64.25 in | Wt 141.4 lb

## 2015-09-18 DIAGNOSIS — K219 Gastro-esophageal reflux disease without esophagitis: Secondary | ICD-10-CM | POA: Diagnosis not present

## 2015-09-18 NOTE — Patient Instructions (Signed)
Follow up as needed

## 2015-09-18 NOTE — Progress Notes (Signed)
[facility] Progress Note  Chief Complaint: GERD and dyspepsia  Subjective History: This is a 25 year old woman last seen by Dr. Juanda ChanceBrodie in May 2016. She was initially seen several years ago for a variety of digestive symptoms and primarily had nonulcer dyspepsia with food sensitivities and chronic reflux symptoms with pyrosis and regurgitation. At that time she was also on ADD medicine which she has since stopped. At the last visit in 2016 she was feeling well with occasional breakthrough symptoms of heartburn. She is interested in talking about stopping her medicine. She requires a fairly bland diet and to avoid symptoms of upper abdominal bloating epigastric pain or reflux symptoms. Her asthma has been under good control. ROS: Cardiovascular:  no chest pain Respiratory: no dyspnea  The patient's Past Medical, Family and Social History were reviewed and are on file in the EMR.  Objective:  Med list reviewed  Vital signs in last 24 hrs: Filed Vitals:   09/18/15 0956  BP: 90/60  Pulse: 84    Physical Exam Well-appearing healthy young woman  HEENT: sclera anicteric, oral mucosa moist without lesions  Neck: supple, no thyromegaly, JVD or lymphadenopathy  Cardiac: RRR without murmurs, S1S2 heard, no peripheral edema  Pulm: clear to auscultation bilaterally, normal RR and effort noted  Abdomen: soft, No tenderness, with active bowel sounds. No guarding or palpable hepatosplenomegaly.  Skin; warm and dry, no jaundice or rash   @ASSESSMENTPLANBEGIN @ Assessment: Encounter Diagnosis  Name Primary?  . Gastroesophageal reflux disease without esophagitis Yes   Mild associated dyspepsia symptoms with food sensitivities. Normal prior endoscopic workup.  Plan: I am in agreement with her weaning off the pantoprazole over the next 2 weeks, she has a sufficient supply to do that. She will then take as needed Pepcid Complete and call me as the need arises.  Charlie PitterHenry L Danis III

## 2015-12-18 ENCOUNTER — Telehealth: Payer: Self-pay

## 2015-12-18 NOTE — Telephone Encounter (Signed)
[facility]  1 daily 90 days supply. Last seen in July 2017.

## 2015-12-19 MED ORDER — PANTOPRAZOLE SODIUM 40 MG PO TBEC: DELAYED_RELEASE_TABLET | ORAL | 1 refills | Status: AC

## 2015-12-19 NOTE — Telephone Encounter (Signed)
Yes, can be refilled for 90 day supply with one refill

## 2015-12-19 NOTE — Telephone Encounter (Signed)
RX refilled as directed by Dr Myrtie Neitheranis
